# Patient Record
Sex: Female | Born: 1980 | Race: White | Hispanic: No | Smoking: Never smoker
Health system: Southern US, Community
[De-identification: ages and names within clinical notes are randomized; demographics above are authoritative.]

## PROBLEM LIST (undated history)

## (undated) ENCOUNTER — Ambulatory Visit: Source: Home / Self Care

## (undated) DIAGNOSIS — R109 Unspecified abdominal pain: Secondary | ICD-10-CM

## (undated) DIAGNOSIS — R531 Weakness: Secondary | ICD-10-CM

## (undated) DIAGNOSIS — N6310 Unspecified lump in the right breast, unspecified quadrant: Secondary | ICD-10-CM

## (undated) DIAGNOSIS — N390 Urinary tract infection, site not specified: Secondary | ICD-10-CM

## (undated) DIAGNOSIS — E876 Hypokalemia: Secondary | ICD-10-CM

## (undated) DIAGNOSIS — M545 Low back pain, unspecified: Secondary | ICD-10-CM

## (undated) DIAGNOSIS — H905 Unspecified sensorineural hearing loss: Secondary | ICD-10-CM

## (undated) DIAGNOSIS — R079 Chest pain, unspecified: Secondary | ICD-10-CM

## (undated) DIAGNOSIS — R10A0 Flank pain, unspecified side: Secondary | ICD-10-CM

## (undated) DIAGNOSIS — H9190 Unspecified hearing loss, unspecified ear: Secondary | ICD-10-CM

## (undated) DIAGNOSIS — R350 Frequency of micturition: Secondary | ICD-10-CM

## (undated) DIAGNOSIS — F419 Anxiety disorder, unspecified: Secondary | ICD-10-CM

## (undated) DIAGNOSIS — R55 Syncope and collapse: Secondary | ICD-10-CM

## (undated) DIAGNOSIS — R519 Headache, unspecified: Secondary | ICD-10-CM

## (undated) DIAGNOSIS — J029 Acute pharyngitis, unspecified: Secondary | ICD-10-CM

## (undated) DIAGNOSIS — H93A1 Pulsatile tinnitus, right ear: Secondary | ICD-10-CM

## (undated) DIAGNOSIS — T148XXA Other injury of unspecified body region, initial encounter: Secondary | ICD-10-CM

## (undated) DIAGNOSIS — Z789 Other specified health status: Secondary | ICD-10-CM

## (undated) DIAGNOSIS — K37 Unspecified appendicitis: Secondary | ICD-10-CM

## (undated) DIAGNOSIS — K047 Periapical abscess without sinus: Secondary | ICD-10-CM

## (undated) HISTORY — DX: Unspecified sensorineural hearing loss: H90.5

## (undated) HISTORY — DX: Syncope and collapse: R55

## (undated) HISTORY — DX: Unspecified appendicitis: K37

## (undated) HISTORY — DX: Other injury of unspecified body region, initial encounter: T14.8XXA

## (undated) HISTORY — DX: Flank pain, unspecified side: R10.A0

## (undated) HISTORY — DX: Urinary tract infection, site not specified: N39.0

## (undated) HISTORY — DX: Pulsatile tinnitus, right ear: H93.A1

## (undated) HISTORY — PX: GALLBLADDER SURGERY: SHX652

## (undated) HISTORY — DX: Frequency of micturition: R35.0

## (undated) HISTORY — DX: Chest pain, unspecified: R07.9

## (undated) HISTORY — DX: Acute pharyngitis, unspecified: J02.9

## (undated) HISTORY — DX: Unspecified lump in the right breast, unspecified quadrant: N63.10

## (undated) HISTORY — DX: Headache, unspecified: R51.9

## (undated) HISTORY — PX: TUBAL LIGATION: SHX77

## (undated) HISTORY — DX: Weakness: R53.1

## (undated) HISTORY — DX: Unspecified abdominal pain: R10.9

## (undated) HISTORY — DX: Hypokalemia: E87.6

## (undated) HISTORY — DX: Periapical abscess without sinus: K04.7

## (undated) HISTORY — DX: Unspecified hearing loss, unspecified ear: H91.90

## (undated) HISTORY — PX: CARPAL TUNNEL RELEASE: SHX101

## (undated) HISTORY — DX: Low back pain, unspecified: M54.50

---
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
## 1998-01-29 ENCOUNTER — Inpatient Hospital Stay (HOSPITAL_COMMUNITY): Admission: AD | Admit: 1998-01-29 | Discharge: 1998-01-29 | Payer: Self-pay | Admitting: Obstetrics

## 1998-03-03 ENCOUNTER — Inpatient Hospital Stay (HOSPITAL_COMMUNITY): Admission: AD | Admit: 1998-03-03 | Discharge: 1998-03-03 | Payer: Self-pay | Admitting: Obstetrics & Gynecology

## 1998-03-03 ENCOUNTER — Inpatient Hospital Stay (HOSPITAL_COMMUNITY): Admission: AD | Admit: 1998-03-03 | Discharge: 1998-03-03 | Payer: Self-pay | Admitting: *Deleted

## 1998-03-04 ENCOUNTER — Inpatient Hospital Stay (HOSPITAL_COMMUNITY): Admission: AD | Admit: 1998-03-04 | Discharge: 1998-03-06 | Payer: Self-pay | Admitting: *Deleted

## 1998-04-09 ENCOUNTER — Emergency Department (HOSPITAL_COMMUNITY): Admission: EM | Admit: 1998-04-09 | Discharge: 1998-04-09 | Payer: Self-pay | Admitting: Emergency Medicine

## 1998-04-09 ENCOUNTER — Encounter: Payer: Self-pay | Admitting: Emergency Medicine

## 1998-10-04 ENCOUNTER — Emergency Department (HOSPITAL_COMMUNITY): Admission: EM | Admit: 1998-10-04 | Discharge: 1998-10-04 | Payer: Self-pay | Admitting: Emergency Medicine

## 1999-12-10 ENCOUNTER — Emergency Department (HOSPITAL_COMMUNITY): Admission: EM | Admit: 1999-12-10 | Discharge: 1999-12-10 | Payer: Self-pay | Admitting: Emergency Medicine

## 1999-12-14 ENCOUNTER — Encounter: Admission: RE | Admit: 1999-12-14 | Discharge: 1999-12-14 | Payer: Self-pay | Admitting: Hematology and Oncology

## 2000-08-25 ENCOUNTER — Emergency Department (HOSPITAL_COMMUNITY): Admission: EM | Admit: 2000-08-25 | Discharge: 2000-08-26 | Payer: Self-pay | Admitting: Emergency Medicine

## 2000-12-23 ENCOUNTER — Emergency Department (HOSPITAL_COMMUNITY): Admission: EM | Admit: 2000-12-23 | Discharge: 2000-12-23 | Payer: Self-pay | Admitting: Emergency Medicine

## 2001-03-10 ENCOUNTER — Inpatient Hospital Stay (HOSPITAL_COMMUNITY): Admission: AD | Admit: 2001-03-10 | Discharge: 2001-03-10 | Payer: Self-pay | Admitting: Obstetrics & Gynecology

## 2001-05-16 ENCOUNTER — Inpatient Hospital Stay (HOSPITAL_COMMUNITY): Admission: AD | Admit: 2001-05-16 | Discharge: 2001-05-16 | Payer: Self-pay | Admitting: *Deleted

## 2003-12-04 ENCOUNTER — Emergency Department (HOSPITAL_COMMUNITY): Admission: EM | Admit: 2003-12-04 | Discharge: 2003-12-04 | Payer: Self-pay | Admitting: Emergency Medicine

## 2004-09-02 ENCOUNTER — Emergency Department: Payer: Self-pay | Admitting: Emergency Medicine

## 2004-09-10 ENCOUNTER — Emergency Department (HOSPITAL_COMMUNITY): Admission: EM | Admit: 2004-09-10 | Discharge: 2004-09-10 | Payer: Self-pay | Admitting: Family Medicine

## 2005-03-17 ENCOUNTER — Emergency Department: Payer: Self-pay | Admitting: Emergency Medicine

## 2006-04-07 ENCOUNTER — Emergency Department (HOSPITAL_COMMUNITY): Admission: EM | Admit: 2006-04-07 | Discharge: 2006-04-08 | Payer: Self-pay | Admitting: Emergency Medicine

## 2006-09-05 ENCOUNTER — Emergency Department (HOSPITAL_COMMUNITY): Admission: EM | Admit: 2006-09-05 | Discharge: 2006-09-05 | Payer: Self-pay | Admitting: Family Medicine

## 2007-09-01 ENCOUNTER — Emergency Department (HOSPITAL_COMMUNITY): Admission: EM | Admit: 2007-09-01 | Discharge: 2007-09-01 | Payer: Self-pay | Admitting: Family Medicine

## 2009-10-08 ENCOUNTER — Emergency Department (HOSPITAL_COMMUNITY): Admission: EM | Admit: 2009-10-08 | Discharge: 2009-10-08 | Payer: Self-pay | Admitting: Family Medicine

## 2010-08-25 ENCOUNTER — Emergency Department (HOSPITAL_COMMUNITY)
Admission: EM | Admit: 2010-08-25 | Discharge: 2010-08-26 | Disposition: A | Discharge status: Home / Self Care | Payer: Self-pay | Attending: Emergency Medicine | Admitting: Emergency Medicine

## 2010-08-25 DIAGNOSIS — E669 Obesity, unspecified: Secondary | ICD-10-CM | POA: Insufficient documentation

## 2010-08-25 DIAGNOSIS — IMO0002 Reserved for concepts with insufficient information to code with codable children: Secondary | ICD-10-CM | POA: Insufficient documentation

## 2010-08-25 DIAGNOSIS — L539 Erythematous condition, unspecified: Secondary | ICD-10-CM | POA: Insufficient documentation

## 2010-08-27 ENCOUNTER — Observation Stay (HOSPITAL_COMMUNITY)
Admission: EM | Admit: 2010-08-27 | Discharge: 2010-08-27 | Disposition: A | Discharge status: Home / Self Care | Payer: Self-pay | Attending: Emergency Medicine | Admitting: Emergency Medicine

## 2010-08-27 DIAGNOSIS — R110 Nausea: Secondary | ICD-10-CM | POA: Insufficient documentation

## 2010-08-27 DIAGNOSIS — L988 Other specified disorders of the skin and subcutaneous tissue: Secondary | ICD-10-CM | POA: Insufficient documentation

## 2010-08-27 DIAGNOSIS — IMO0002 Reserved for concepts with insufficient information to code with codable children: Principal | ICD-10-CM | POA: Insufficient documentation

## 2010-08-27 DIAGNOSIS — E669 Obesity, unspecified: Secondary | ICD-10-CM | POA: Insufficient documentation

## 2010-08-27 LAB — CBC
HCT: 42 % (ref 36.0–46.0)
Hemoglobin: 13.9 g/dL (ref 12.0–15.0)
MCH: 28.5 pg (ref 26.0–34.0)
MCHC: 33.1 g/dL (ref 30.0–36.0)
MCV: 86.2 fL (ref 78.0–100.0)
Platelets: 306 10*3/uL (ref 150–400)
RBC: 4.87 MIL/uL (ref 3.87–5.11)
RDW: 12.9 % (ref 11.5–15.5)
WBC: 8.8 10*3/uL (ref 4.0–10.5)

## 2010-08-27 LAB — BASIC METABOLIC PANEL
BUN: 13 mg/dL (ref 6–23)
CO2: 24 mEq/L (ref 19–32)
Calcium: 9.4 mg/dL (ref 8.4–10.5)
Chloride: 109 mEq/L (ref 96–112)
Creatinine, Ser: 0.75 mg/dL (ref 0.4–1.2)
GFR calc Af Amer: >60 mL/min (ref >60)
GFR calc non Af Amer: >60 mL/min (ref >60)
Glucose, Bld: 101 mg/dL — ABNORMAL HIGH (ref 70–99)
Potassium: 3.5 mEq/L (ref 3.5–5.1)
Sodium: 139 mEq/L (ref 135–145)

## 2010-08-27 LAB — URINALYSIS, ROUTINE W REFLEX MICROSCOPIC
Bilirubin Urine: NEGATIVE
Ketones, ur: NEGATIVE mg/dL
Nitrite: NEGATIVE
Protein, ur: NEGATIVE mg/dL
Specific Gravity, Urine: 1.027 (ref 1.005–1.030)
Urine Glucose, Fasting: NEGATIVE mg/dL
Urobilinogen, UA: 1 mg/dL (ref 0.0–1.0)
pH: 5.5 (ref 5.0–8.0)

## 2010-08-27 LAB — DIFFERENTIAL
Basophils Absolute: 0 10*3/uL (ref 0.0–0.1)
Basophils Relative: 0 % (ref 0–1)
Eosinophils Absolute: 0.2 10*3/uL (ref 0.0–0.7)
Eosinophils Relative: 2 % (ref 0–5)
Lymphocytes Relative: 31 % (ref 12–46)
Lymphs Abs: 2.8 10*3/uL (ref 0.7–4.0)
Monocytes Absolute: 0.6 10*3/uL (ref 0.1–1.0)
Monocytes Relative: 7 % (ref 3–12)
Neutro Abs: 5.3 10*3/uL (ref 1.7–7.7)
Neutrophils Relative %: 60 % (ref 43–77)

## 2010-08-27 LAB — POCT PREGNANCY, URINE: Preg Test, Ur: NEGATIVE

## 2010-08-27 LAB — URINE MICROSCOPIC-ADD ON

## 2011-03-21 ENCOUNTER — Emergency Department: Payer: Self-pay | Admitting: *Deleted

## 2011-11-18 ENCOUNTER — Emergency Department: Payer: Self-pay | Admitting: Emergency Medicine

## 2011-12-27 ENCOUNTER — Encounter (HOSPITAL_COMMUNITY): Payer: Self-pay | Admitting: *Deleted

## 2011-12-27 ENCOUNTER — Emergency Department (HOSPITAL_COMMUNITY)
Admission: EM | Admit: 2011-12-27 | Discharge: 2011-12-27 | Disposition: A | Discharge status: Home / Self Care | Payer: Self-pay | Attending: Emergency Medicine | Admitting: Emergency Medicine

## 2011-12-27 DIAGNOSIS — J020 Streptococcal pharyngitis: Secondary | ICD-10-CM | POA: Insufficient documentation

## 2011-12-27 MED ORDER — CLINDAMYCIN HCL 150 MG PO CAPS: 450.0000 mg | ORAL_CAPSULE | Freq: Three times a day (TID) | ORAL | Status: AC

## 2011-12-27 MED ORDER — DEXAMETHASONE SODIUM PHOSPHATE 10 MG/ML IJ SOLN
10.0000 mg | Freq: Once | INTRAMUSCULAR | Status: AC
  Administered 2011-12-27: 10 mg via INTRAMUSCULAR
  Filled 2011-12-27: qty 1

## 2011-12-27 MED ORDER — CLINDAMYCIN HCL 300 MG PO CAPS
450.0000 mg | ORAL_CAPSULE | Freq: Once | ORAL | Status: AC
  Administered 2011-12-27: 450 mg via ORAL
  Filled 2011-12-27: qty 1

## 2011-12-27 NOTE — Discharge Instructions (Signed)
1. [facility] - [address] in [place] located on the [place] side of the long-term care facility. Pharmacy hours are 7:30 a.m. - 5:30 p.m. Monday through Friday. You can reach the [facility] location at [phone]. 2. [facility] - [address] in [place]. Pharmacy hours are 7:30 a.m. - 7:30 p.m. Monday through Friday. You can reach the [facility] location at [phone]. 3. [facility] - [address] [employer] in [place]. Pharmacy hours are 7:30 a.m. until 7:30 p.m. Monday through Friday. You can reach the [facility] location at [phone].   Use ibuprofen every 6-8 hours as needed for pain/fever. Take the antibiotic as directed. You should start to feel better in 24-48 hours.  Strep Throat Strep throat is an infection of the throat caused by a bacteria named Streptococcus pyogenes. Your caregiver may call the infection streptococcal "tonsillitis" or "pharyngitis" depending on whether there are signs of inflammation in the tonsils or back of the throat. Strep throat is most common in children from 57 to 13 years old during the cold months of the year, but it can occur in people of any age during any season. This infection is spread from person to person (contagious) through coughing, sneezing, or other close contact. SYMPTOMS   Fever or chills.   Painful, swollen, red tonsils or throat.   Pain or difficulty when swallowing.   White or yellow spots on the tonsils or throat.   Swollen, tender lymph nodes or "glands" of the neck or under the jaw.   Red rash all over the body (rare).  DIAGNOSIS  Many different infections can cause the same symptoms. A test must be done to confirm the diagnosis so the right treatment can be given. A "rapid strep test" can help your caregiver make the diagnosis in a few minutes. If this test is not available, a light swab of the infected area can be used for a throat culture test. If a  throat culture test is done, results are usually available in a day or two. TREATMENT  Strep throat is treated with antibiotic medicine. HOME CARE INSTRUCTIONS   Gargle with 1 tsp of salt in 1 cup of warm water, 3 to 4 times per day or as needed for comfort.   Family members who also have a sore throat or fever should be tested for strep throat and treated with antibiotics if they have the strep infection.   Make sure everyone in your household washes their hands well.   Do not share food, drinking cups, or personal items that could cause the infection to spread to others.   You may need to eat a soft food diet until your sore throat gets better.   Drink enough water and fluids to keep your urine clear or pale yellow. This will help prevent dehydration.   Get plenty of rest.   Stay home from school, daycare, or work until you have been on antibiotics for 24 hours.   Only take over-the-counter or prescription medicines for pain, discomfort, or fever as directed by your caregiver.   If antibiotics are prescribed, take them as directed. Finish them even if you start to feel better.  SEEK MEDICAL CARE IF:   The glands in your neck continue to enlarge.   You develop a rash, cough, or earache.   You cough up green, yellow-brown, or bloody sputum.   You have pain or discomfort not controlled by  medicines.   Your problems seem to be getting worse rather than better.  SEEK IMMEDIATE MEDICAL CARE IF:   You develop any new symptoms such as vomiting, severe headache, stiff or painful neck, chest pain, shortness of breath, or trouble swallowing.   You develop severe throat pain, drooling, or changes in your voice.   You develop swelling of the neck, or the skin on the neck becomes red and tender.   You have a fever.   You develop signs of dehydration, such as fatigue, dry mouth, and decreased urination.   You become increasingly sleepy, or you cannot wake up completely.  Document  Released: 07/02/2000 Document Revised: 06/24/2011 Document Reviewed: 09/03/2010 [facility] Patient Information 2012 [place], [place]

## 2011-12-27 NOTE — ED Notes (Signed)
The pp has had a sorethroat since yesterday no temp

## 2011-12-27 NOTE — ED Provider Notes (Signed)
History     CSN: [id number]  Arrival date & time 12/27/11  1953   First MD Initiated Contact with Patient 12/27/11 2042      Chief Complaint  Patient presents with  . Sore Throat    (Consider location/radiation/quality/duration/timing/severity/associated sxs/prior treatment) The history is provided by the patient.   there is female with no past medical history presents to the emergency department with chief complaint of 1 and a sore throat associated with chills, difficulty swallowing, swollen glands in the neck. Denies fever, ear pain, rhinorrhea, cough, chest pain, shortness of breath. Swallowing makes her symptoms worse. Nothing makes it better. Has taken tylenol without much relief.  History reviewed. No pertinent past medical history.  History reviewed. No pertinent past surgical history.  No family history on file.  History  Substance Use Topics  . Smoking status: Never Smoker   . Smokeless tobacco: Not on file  . Alcohol Use: No     Review of Systems  Constitutional: Positive for chills. Negative for fever.  HENT: Positive for sore throat and trouble swallowing. Negative for ear pain, congestion, rhinorrhea, neck pain, neck stiffness and voice change.   Eyes: Negative for pain.  Respiratory: Negative for shortness of breath.   Cardiovascular: Negative for chest pain.  Hematological: Positive for adenopathy.    Allergies  Penicillins  Home Medications   Current Outpatient Rx  Name Route Sig Dispense Refill  . ACETAMINOPHEN 500 MG PO TABS Oral Take 1,000 mg by mouth every 6 (six) hours as needed. For pain      BP 114/74  Pulse 107  Temp(Src) 98.5 F (36.9 C) (Oral)  Resp 18  SpO2 99%  LMP 12/06/2011  Physical Exam  Constitutional: She appears well-developed and well-nourished.       VS reviewed, sig for slight tachycardia. Non-toxic appearing  HENT:  Head: Normocephalic and atraumatic. No trismus in the jaw.  Right Ear: External ear normal.  Left  Ear: External ear normal.  Nose: Nose normal.  Mouth/Throat: Uvula is midline and mucous membranes are normal. No uvula swelling. Oropharyngeal exudate, posterior oropharyngeal edema and posterior oropharyngeal erythema present. No tonsillar abscesses.  Neck: Normal range of motion. Neck supple.  Cardiovascular: Normal rate, regular rhythm and normal heart sounds.   Pulmonary/Chest: Effort normal and breath sounds normal. No respiratory distress. She has no wheezes.  Lymphadenopathy:    She has cervical adenopathy.  Neurological: She is alert.  Skin: Skin is warm.    ED Course  Procedures (including critical care time)  Labs Reviewed  RAPID STREP SCREEN - Abnormal; Notable for the following:    Streptococcus, Group A Screen (Direct) POSITIVE (*)    All other components within normal limits   No results found.   Dx 1: Strep pharyngitis   MDM  Strep pharyngitis. Allergic to PCN, will tx with clinda. Airway patent, able to swallow with pain. No evidence of ludwig angina, peritonsillar abscess.        Shaaron Adler, [place] 12/27/11 2051

## 2011-12-31 NOTE — ED Provider Notes (Signed)
Medical screening examination/treatment/procedure(s) were performed by non-physician practitioner and as supervising physician I was immediately available for consultation/collaboration.    Nelia Shi, MD 12/31/11 1110

## 2012-01-18 ENCOUNTER — Emergency Department (HOSPITAL_COMMUNITY)
Admission: EM | Admit: 2012-01-18 | Discharge: 2012-01-19 | Disposition: A | Discharge status: Home / Self Care | Payer: Self-pay | Attending: Emergency Medicine | Admitting: Emergency Medicine

## 2012-01-18 ENCOUNTER — Encounter (HOSPITAL_COMMUNITY): Payer: Self-pay | Admitting: *Deleted

## 2012-01-18 DIAGNOSIS — T310 Burns involving less than 10% of body surface: Secondary | ICD-10-CM | POA: Insufficient documentation

## 2012-01-18 DIAGNOSIS — T24202A Burn of second degree of unspecified site of left lower limb, except ankle and foot, initial encounter: Secondary | ICD-10-CM

## 2012-01-18 DIAGNOSIS — T24239A Burn of second degree of unspecified lower leg, initial encounter: Secondary | ICD-10-CM | POA: Insufficient documentation
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
## 2012-01-19 MED ORDER — HYDROCODONE-ACETAMINOPHEN 5-325 MG PO TABS: 1.0000 | ORAL_TABLET | Freq: Four times a day (QID) | ORAL | Status: AC | PRN

## 2012-01-19 MED ORDER — CLINDAMYCIN HCL 150 MG PO CAPS: 150.0000 mg | ORAL_CAPSULE | Freq: Four times a day (QID) | ORAL | Status: AC

## 2012-01-19 MED ORDER — SILVER SULFADIAZINE 1 % EX CREA
TOPICAL_CREAM | Freq: Two times a day (BID) | CUTANEOUS | Status: DC
  Administered 2012-01-19: 01:00:00 via TOPICAL
  Filled 2012-01-19: qty 85

## 2012-01-19 MED ORDER — SILVER SULFADIAZINE 1 % EX CREA: TOPICAL_CREAM | Freq: Two times a day (BID) | CUTANEOUS | Status: DC

## 2012-01-19 NOTE — ED Provider Notes (Signed)
Medical screening examination/treatment/procedure(s) were performed by non-physician practitioner and as supervising physician I was immediately available for consultation/collaboration.  Doug Sou, MD 01/19/12 [phone]
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
## 2012-03-10 ENCOUNTER — Emergency Department: Payer: Self-pay | Admitting: Emergency Medicine

## 2012-03-10 LAB — PREGNANCY, URINE: Pregnancy Test, Urine: NEGATIVE m[IU]/mL

## 2012-03-10 LAB — URINALYSIS, COMPLETE
Bilirubin,UR: NEGATIVE
Blood: NEGATIVE
Ph: 5 (ref 4.5–8.0)
Protein: NEGATIVE
RBC,UR: 2 /HPF (ref 0–5)
Squamous Epithelial: 5

## 2012-04-19 ENCOUNTER — Emergency Department (HOSPITAL_COMMUNITY)
Admission: EM | Admit: 2012-04-19 | Discharge: 2012-04-20 | Disposition: A | Discharge status: Home / Self Care | Payer: Self-pay | Attending: Emergency Medicine | Admitting: Emergency Medicine

## 2012-04-19 ENCOUNTER — Encounter (HOSPITAL_COMMUNITY): Payer: Self-pay | Admitting: Adult Health

## 2012-04-19 DIAGNOSIS — R110 Nausea: Secondary | ICD-10-CM | POA: Insufficient documentation

## 2012-04-19 DIAGNOSIS — R51 Headache: Secondary | ICD-10-CM | POA: Insufficient documentation

## 2012-04-19 DIAGNOSIS — M542 Cervicalgia: Secondary | ICD-10-CM | POA: Insufficient documentation

## 2012-04-19 MED ORDER — DEXAMETHASONE SODIUM PHOSPHATE 10 MG/ML IJ SOLN
10.0000 mg | Freq: Once | INTRAMUSCULAR | Status: AC
  Administered 2012-04-19: 10 mg via INTRAVENOUS
  Filled 2012-04-19: qty 1

## 2012-04-19 MED ORDER — METOCLOPRAMIDE HCL 5 MG/ML IJ SOLN
10.0000 mg | Freq: Once | INTRAMUSCULAR | Status: AC
  Administered 2012-04-19: 10 mg via INTRAVENOUS
  Filled 2012-04-19: qty 2

## 2012-04-19 MED ORDER — SODIUM CHLORIDE 0.9 % IV SOLN
INTRAVENOUS | Status: DC
  Administered 2012-04-19: via INTRAVENOUS

## 2012-04-19 MED ORDER — DIPHENHYDRAMINE HCL 50 MG/ML IJ SOLN
12.5000 mg | Freq: Once | INTRAMUSCULAR | Status: AC
  Administered 2012-04-19: 12.5 mg via INTRAVENOUS
  Filled 2012-04-19: qty 1
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
## 2012-04-20 ENCOUNTER — Emergency Department (HOSPITAL_COMMUNITY): Payer: Self-pay

## 2012-04-20 ENCOUNTER — Encounter (HOSPITAL_COMMUNITY): Payer: Self-pay | Admitting: Radiology

## 2012-04-20 MED ORDER — KETOROLAC TROMETHAMINE 30 MG/ML IJ SOLN: 30.0000 mg | Freq: Once | INTRAMUSCULAR | Status: DC

## 2012-04-20 MED ORDER — HYDROCODONE-ACETAMINOPHEN 5-325 MG PO TABS: 1.0000 | ORAL_TABLET | ORAL | Status: DC | PRN

## 2012-04-20 MED ORDER — BUTALBITAL-APAP-CAFFEINE 50-325-40 MG PO TABS: 1.0000 | ORAL_TABLET | Freq: Four times a day (QID) | ORAL | Status: DC | PRN

## 2012-04-20 NOTE — ED Notes (Signed)
Pt denies any questions, verbalize understanding of f/u instructions and no driving to prescription medications upon discharge.

## 2012-04-20 NOTE — ED Notes (Signed)
Pt resting with eyes closed, resp e/u and no s/s of any pain or distress observed at this time and will continue to monitor pt.
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
## 2012-04-20 NOTE — ED Provider Notes (Signed)
Medical screening examination/treatment/procedure(s) were performed by non-physician practitioner and as supervising physician I was immediately available for consultation/collaboration.    Vida Roller, MD 04/20/12 [phone]

## 2012-05-17 ENCOUNTER — Emergency Department (HOSPITAL_COMMUNITY): Payer: Self-pay

## 2012-05-17 ENCOUNTER — Emergency Department (HOSPITAL_COMMUNITY)
Admission: EM | Admit: 2012-05-17 | Discharge: 2012-05-17 | Disposition: A | Discharge status: Home / Self Care | Payer: Self-pay | Attending: Emergency Medicine | Admitting: Emergency Medicine

## 2012-05-17 DIAGNOSIS — R5381 Other malaise: Secondary | ICD-10-CM | POA: Insufficient documentation

## 2012-05-17 DIAGNOSIS — R0602 Shortness of breath: Secondary | ICD-10-CM | POA: Insufficient documentation

## 2012-05-17 DIAGNOSIS — R079 Chest pain, unspecified: Secondary | ICD-10-CM

## 2012-05-17 DIAGNOSIS — R0789 Other chest pain: Secondary | ICD-10-CM | POA: Insufficient documentation

## 2012-05-17 DIAGNOSIS — R42 Dizziness and giddiness: Secondary | ICD-10-CM | POA: Insufficient documentation

## 2012-05-17 LAB — CBC WITH DIFFERENTIAL/PLATELET
Basophils Absolute: 0 10*3/uL (ref 0.0–0.1)
Eosinophils Absolute: 0.1 10*3/uL (ref 0.0–0.7)
Lymphocytes Relative: 25 % (ref 12–46)
Lymphs Abs: 2.4 10*3/uL (ref 0.7–4.0)
Neutrophils Relative %: 67 % (ref 43–77)
Platelets: 311 10*3/uL (ref 150–400)
RBC: 4.84 MIL/uL (ref 3.87–5.11)
RDW: 12.7 % (ref 11.5–15.5)
WBC: 9.6 10*3/uL (ref 4.0–10.5)

## 2012-05-17 LAB — URINALYSIS, ROUTINE W REFLEX MICROSCOPIC
Glucose, UA: NEGATIVE mg/dL
Ketones, ur: 15 mg/dL — AB
Leukocytes, UA: NEGATIVE
Nitrite: NEGATIVE
Protein, ur: NEGATIVE mg/dL

## 2012-05-17 LAB — POCT I-STAT TROPONIN I: Troponin i, poc: 0 ng/mL (ref 0.00–0.08)

## 2012-05-17 LAB — D-DIMER, QUANTITATIVE: D-Dimer, Quant: <0.27 ug/mL-FEU (ref 0.00–0.48)

## 2012-05-17 LAB — URINE MICROSCOPIC-ADD ON

## 2012-05-17 LAB — BASIC METABOLIC PANEL
CO2: 25 mEq/L (ref 19–32)
Calcium: 9.6 mg/dL (ref 8.4–10.5)
GFR calc non Af Amer: >90 mL/min (ref >90)
Glucose, Bld: 98 mg/dL (ref 70–99)
Potassium: 3 mEq/L — ABNORMAL LOW (ref 3.5–5.1)
Sodium: 135 mEq/L (ref 135–145)

## 2012-05-17 LAB — PREGNANCY, URINE: Preg Test, Ur: NEGATIVE

## 2012-05-17 MED ORDER — POTASSIUM CHLORIDE CRYS ER 20 MEQ PO TBCR
40.0000 meq | EXTENDED_RELEASE_TABLET | Freq: Once | ORAL | Status: AC
  Administered 2012-05-17: 40 meq via ORAL
  Filled 2012-05-17: qty 2

## 2012-05-17 MED ORDER — SODIUM CHLORIDE 0.9 % IV BOLUS (SEPSIS)
1000.0000 mL | Freq: Once | INTRAVENOUS | Status: AC
  Administered 2012-05-17: 1000 mL via INTRAVENOUS

## 2012-05-17 MED ORDER — ONDANSETRON HCL 4 MG/2ML IJ SOLN
4.0000 mg | Freq: Once | INTRAMUSCULAR | Status: AC
  Administered 2012-05-17: 4 mg via INTRAVENOUS
  Filled 2012-05-17: qty 2

## 2012-05-17 MED ORDER — OMEPRAZOLE 20 MG PO CPDR: 20.0000 mg | DELAYED_RELEASE_CAPSULE | Freq: Every day | ORAL | Status: DC

## 2012-05-17 MED ORDER — ALBUTEROL SULFATE (5 MG/ML) 0.5% IN NEBU
5.0000 mg | INHALATION_SOLUTION | Freq: Once | RESPIRATORY_TRACT | Status: AC
  Administered 2012-05-17: 5 mg via RESPIRATORY_TRACT
  Filled 2012-05-17: qty 1

## 2012-05-17 MED ORDER — LORAZEPAM 1 MG PO TABS
1.0000 mg | ORAL_TABLET | Freq: Once | ORAL | Status: AC
  Administered 2012-05-17: 1 mg via ORAL
  Filled 2012-05-17: qty 1

## 2012-05-17 NOTE — ED Provider Notes (Signed)
Medical screening examination/treatment/procedure(s) were performed by non-physician practitioner and as supervising physician I was immediately available for consultation/collaboration.  Gerhard Munch, MD 05/17/12 [phone]

## 2012-05-17 NOTE — ED Provider Notes (Signed)
Patient moved to the CDU for a delta troponin result. Patient with sudden onset of substernal chest pressure last night which persisted throughout the evening. This morning she episode of dizziness and near syncope.  Lab results largely unremarkable. He d-dimer negative.  Initial troponin negative.  Patient is resting comfortably this time and tolerating PO without difficulty.  Pt states the pressures in her chest remains, but she has no other complaints. On exam: hemodynamically stable, NAD, heart w/ RRR, lungs CTAB, Chest & abd non-tender, no peripheral edema or calf tenderness.   12:58 PM   Repeat troponin negative, IV fluids given. Pt states she feels moderately better after fluids.  Patient is to be discharged with recommendation to follow up with PCP in regards to today's hospital visit. Chest pain is not likely of cardiac or pulmonary etiology d/t presentation, perc negative, VSS, no tracheal deviation, no JVD or new murmur, RRR, breath sounds equal bilaterally, EKG without acute abnormalities, negative troponin, and negative CXR. Pt has been advised start a PPI and return to the ED if CP becomes exertional, associated with diaphoresis or nausea, radiates to left jaw/arm, worsens or becomes concerning in any way. Pt is agreeable to discharge.    1. Medications: prilosec  2. Treatment: rest, drink plenty of fluids  3. Follow Up: with primary care physician, if you do not have a PCP use the resources provided to find one   Dierdre Forth, PA-C 05/17/12 1346

## 2012-05-17 NOTE — ED Provider Notes (Signed)
History     CSN: [id number]  Arrival date & time 05/17/12  4098   First MD Initiated Contact with Patient 05/17/12 [phone]      Chief Complaint  Patient presents with  . Chest Pain    (Consider location/radiation/quality/duration/timing/severity/associated sxs/prior treatment) Patient is a 31 y.o. female presenting with chest pain. The history is provided by the patient.  Chest Pain The chest pain began yesterday. Chest pain occurs constantly. The chest pain is unchanged. The quality of the pain is described as pressure-like. The pain does not radiate. Primary symptoms include fatigue, shortness of breath and dizziness. Pertinent negatives for primary symptoms include no fever, no cough, no wheezing, no palpitations, no abdominal pain, no nausea and no vomiting.  Dizziness does not occur with nausea, vomiting or weakness.   Pertinent negatives for associated symptoms include no weakness.   Pt states was not feeling well yesterday. Developed substernal pressure last night, lasted all through the night. States this morning, symptoms became worse. States felt like was going to faint when got up to get ready. States chest pain feels like pressure, associated with SOB. No pleuritic component. NO URI symptoms, no abdominal pain, no N/V. No recent travel or surgeries. Not a smoker.   No past medical history on file.  Past Surgical History  Procedure Date  . Tubal ligation     No family history on file.  History  Substance Use Topics  . Smoking status: Never Smoker   . Smokeless tobacco: Not on file  . Alcohol Use: No    OB History    Grav Para Term Preterm Abortions TAB SAB Ect Mult Living                  Review of Systems  Constitutional: Positive for fatigue. Negative for fever and chills.  Respiratory: Positive for chest tightness and shortness of breath. Negative for cough and wheezing.   Cardiovascular: Positive for chest pain. Negative for palpitations and leg swelling.   Gastrointestinal: Negative for nausea, vomiting and abdominal pain.  Neurological: Positive for dizziness and light-headedness. Negative for weakness and headaches.  All other systems reviewed and are negative.    Allergies  Penicillins  Home Medications   Current Outpatient Rx  Name Route Sig Dispense Refill  . ACETAMINOPHEN 500 MG PO TABS Oral Take 1,000 mg by mouth every 6 (six) hours as needed. For pain    . BUTALBITAL-APAP-CAFFEINE 50-325-40 MG PO TABS Oral Take 1-2 tablets by mouth every 6 (six) hours as needed for headache. Do not exceed 6 tablets in 24 hours 20 tablet 0  . HYDROCODONE-ACETAMINOPHEN 5-325 MG PO TABS Oral Take 1 tablet by mouth every 4 (four) hours as needed for pain. 10 tablet 0    LMP 04/13/2012  Physical Exam  Nursing note and vitals reviewed. Constitutional: She is oriented to person, place, and time. She appears well-developed and well-nourished. No distress.  Eyes: Conjunctivae normal are normal.  Neck: Neck supple.  Cardiovascular: Regular rhythm and normal heart sounds.        tachycardic  Pulmonary/Chest: Effort normal and breath sounds normal. She has no wheezes. She has no rales. She exhibits no tenderness.       tachypnea  Musculoskeletal: She exhibits no edema.       No calf tenderness  Neurological: She is alert and oriented to person, place, and time.  Skin: Skin is warm and dry.  Psychiatric: She has a normal mood and affect.    ED  Course  Procedures (including critical care time)  Pt appears anxious, she is tachypnec, tachycardic. Will get labs CXR. Will monitor.    Date: 05/17/2012  Rate: 102  Rhythm: sinus tachycardia  QRS Axis: normal  Intervals: normal  ST/T Wave abnormalities: nonspecific T wave changes  Conduction Disutrbances:none  Narrative Interpretation:   Old EKG Reviewed: none available  Results for orders placed during the hospital encounter of 05/17/12  CBC WITH DIFFERENTIAL      Component Value Range   WBC  9.6  4.0 - 10.5 K/uL   RBC 4.84  3.87 - 5.11 MIL/uL   Hemoglobin 14.2  12.0 - 15.0 g/dL   HCT 96.0  45.4 - 09.8 %   MCV 84.5  78.0 - 100.0 fL   MCH 29.3  26.0 - 34.0 pg   MCHC 34.7  30.0 - 36.0 g/dL   RDW 11.9  14.7 - 82.9 %   Platelets 311  150 - 400 K/uL   Neutrophils Relative 67  43 - 77 %   Neutro Abs 6.5  1.7 - 7.7 K/uL   Lymphocytes Relative 25  12 - 46 %   Lymphs Abs 2.4  0.7 - 4.0 K/uL   Monocytes Relative 7  3 - 12 %   Monocytes Absolute 0.6  0.1 - 1.0 K/uL   Eosinophils Relative 1  0 - 5 %   Eosinophils Absolute 0.1  0.0 - 0.7 K/uL   Basophils Relative 0  0 - 1 %   Basophils Absolute 0.0  0.0 - 0.1 K/uL  BASIC METABOLIC PANEL      Component Value Range   Sodium 135  135 - 145 mEq/L   Potassium 3.0 (*) 3.5 - 5.1 mEq/L   Chloride 100  96 - 112 mEq/L   CO2 25  19 - 32 mEq/L   Glucose, Bld 98  70 - 99 mg/dL   BUN 13  6 - 23 mg/dL   Creatinine, Ser 5.62  0.50 - 1.10 mg/dL   Calcium 9.6  8.4 - 13.0 mg/dL   GFR calc non Af Amer >90  >90 mL/min   GFR calc Af Amer >90  >90 mL/min  D-DIMER, QUANTITATIVE      Component Value Range   D-Dimer, Quant <0.27  0.00 - 0.48 ug/mL-FEU  URINALYSIS, ROUTINE W REFLEX MICROSCOPIC      Component Value Range   Color, Urine AMBER (*) YELLOW   APPearance CLEAR  CLEAR   Specific Gravity, Urine 1.028  1.005 - 1.030   pH 6.5  5.0 - 8.0   Glucose, UA NEGATIVE  NEGATIVE mg/dL   Hgb urine dipstick SMALL (*) NEGATIVE   Bilirubin Urine SMALL (*) NEGATIVE   Ketones, ur 15 (*) NEGATIVE mg/dL   Protein, ur NEGATIVE  NEGATIVE mg/dL   Urobilinogen, UA 1.0  0.0 - 1.0 mg/dL   Nitrite NEGATIVE  NEGATIVE   Leukocytes, UA NEGATIVE  NEGATIVE  PREGNANCY, URINE      Component Value Range   Preg Test, Ur NEGATIVE  NEGATIVE  POCT I-STAT TROPONIN I      Component Value Range   Troponin i, poc 0.01  0.00 - 0.08 ng/mL   Comment 3           URINE MICROSCOPIC-ADD ON      Component Value Range   Squamous Epithelial / LPF RARE  RARE   WBC, UA 0-2  <3  WBC/hpf   RBC / HPF 0-2  <3 RBC/hpf   Bacteria, UA  RARE  RARE   Urine-Other MUCOUS PRESENT    POCT I-STAT TROPONIN I      Component Value Range   Troponin i, poc 0.00  0.00 - 0.08 ng/mL   Comment 3            Dg Chest 2 View  05/17/2012  *RADIOLOGY REPORT*  Clinical Data: Chest pressure and shortness of breath.  CHEST - 2 VIEW  Comparison: 05/17/2012.  Findings: Trachea is midline.  Heart size normal. There may be minimal atelectasis at the right lung base versus superimposition of shadows.  No pleural fluid.  IMPRESSION: Question minimal atelectasis at the right lung base.  Lungs are otherwise clear.   Original Report Authenticated By: Reyes Ivan, M.D.     Labs unremarkable. CDU for recheck of troponin at 11:45. If negative, ok to go home. Pt does appear very anxious, ativan given. Her ECG, labs, troponin x1, d dimer negative. She has no risk factors for CAD.  Disposition home if negative enzymes with pcp follow up    1. Chest pain       MDM          Lottie Mussel, PA 05/17/12 1525

## 2012-05-17 NOTE — ED Notes (Signed)
LUNCH ORDERED FOR PT 

## 2012-05-17 NOTE — ED Provider Notes (Signed)
Medical screening examination/treatment/procedure(s) were performed by non-physician practitioner and as supervising physician I was immediately available for consultation/collaboration.  Veretta Sabourin, MD 05/17/12 1557 

## 2012-05-17 NOTE — ED Notes (Signed)
Family at bedside. 

## 2012-05-17 NOTE — ED Notes (Signed)
PT reports sudden set of substernal CP. Denies any SHOP. Dizzyness. EMS gve 1 ngt SL and 324 ASA.

## 2012-05-17 NOTE — ED Notes (Signed)
Phlebotomy at the bedside  

## 2012-08-17 ENCOUNTER — Ambulatory Visit: Payer: Self-pay | Admitting: Orthopedic Surgery

## 2012-09-07 ENCOUNTER — Emergency Department (INDEPENDENT_AMBULATORY_CARE_PROVIDER_SITE_OTHER)
Admission: EM | Admit: 2012-09-07 | Discharge: 2012-09-07 | Disposition: A | Discharge status: Home / Self Care | Payer: Self-pay | Source: Home / Self Care

## 2012-09-07 ENCOUNTER — Encounter (HOSPITAL_COMMUNITY): Payer: Self-pay | Admitting: Emergency Medicine

## 2012-09-07 DIAGNOSIS — H9319 Tinnitus, unspecified ear: Secondary | ICD-10-CM

## 2012-09-07 DIAGNOSIS — H6980 Other specified disorders of Eustachian tube, unspecified ear: Secondary | ICD-10-CM

## 2012-09-07 DIAGNOSIS — R0982 Postnasal drip: Secondary | ICD-10-CM

## 2012-09-07 MED ORDER — FLUTICASONE PROPIONATE 50 MCG/ACT NA SUSP: 2.0000 | Freq: Every day | NASAL | Status: DC

## 2012-09-07 NOTE — ED Notes (Signed)
Pt c/o abnormal sound in right ear with decrease hearing. Some pain. Pt has not tried any otc meds for treatment. Symptoms present for a few days.

## 2012-09-07 NOTE — ED Provider Notes (Signed)
History     CSN: [id number]  Arrival date & time 09/07/12  1820   None     Chief Complaint  Patient presents with  . Otalgia    abnormal sound in right ear with decrease hearing.     (Consider location/radiation/quality/duration/timing/severity/associated sxs/prior treatment) HPI Comments: 32 year old female presents with discomfort in her right year for 4 days. She describes tinnitus which sounds like a fetal heart tone with Doppler. She states the ear feels stopped up in areas of diminished hearing. She denies upper respiratory congestion, PND, sore throat, fever, cough or other complaints.   History reviewed. No pertinent past medical history.  Past Surgical History  Procedure Laterality Date  . Tubal ligation    . Carpal tunnel release      History reviewed. No pertinent family history.  History  Substance Use Topics  . Smoking status: Never Smoker   . Smokeless tobacco: Not on file  . Alcohol Use: No    OB History   Grav Para Term Preterm Abortions TAB SAB Ect Mult Living                  Review of Systems  HENT:       See history of present illness  All other systems reviewed and are negative.    Allergies  Penicillins  Home Medications   Current Outpatient Rx  Name  Route  Sig  Dispense  Refill  . acetaminophen (TYLENOL) 500 MG tablet   Oral   Take 1,000 mg by mouth every 6 (six) hours as needed. For pain         . butalbital-acetaminophen-caffeine (FIORICET) 50-325-40 MG per tablet   Oral   Take 1-2 tablets by mouth every 6 (six) hours as needed for headache. Do not exceed 6 tablets in 24 hours   20 tablet   0   . fluticasone (FLONASE) 50 MCG/ACT nasal spray   Nasal   Place 2 sprays into the nose daily.   16 g   1   . HYDROcodone-acetaminophen (NORCO/VICODIN) 5-325 MG per tablet   Oral   Take 1 tablet by mouth every 4 (four) hours as needed for pain.   10 tablet   0   . omeprazole (PRILOSEC) 20 MG capsule   Oral   Take 1  capsule (20 mg total) by mouth daily.   30 capsule   0     BP 133/88  Pulse 78  Temp(Src) 99.3 F (37.4 C) (Oral)  Resp 20  SpO2 100%  LMP 08/19/2012  Physical Exam  Nursing note and vitals reviewed. Constitutional: She appears well-developed and well-nourished. No distress.  HENT:  Right TM pearly gray, entrance parent, retracted. No effusions or erythema. The EAC is clear. Left TM and EAC is normal Oropharynx with erythema mild tonsillar swelling and moderate clear PND. She is observe clearing her throat several times.  Eyes: Conjunctivae and EOM are normal.  Neck: Normal range of motion. Neck supple.  Cardiovascular: Normal rate, regular rhythm and normal heart sounds.   Pulmonary/Chest: Effort normal and breath sounds normal. No respiratory distress. She has no wheezes. She has no rales.  Lymphadenopathy:    She has no cervical adenopathy.  Neurological: She is alert.  Skin: Skin is warm and dry.    ED Course  Procedures (including critical care time)  Labs Reviewed - No data to display No results found.   1. ETD (eustachian tube dysfunction), right   2. PND (post-nasal drip)  3. Tinnitus of right ear       MDM  The patient has PND and a mild red posterior pharynx. Her right TM is retracted and I suspect this is what is causing the tinnitus and discomfort. Recommend Sudafed PE 10 mg every 4 hours when necessary Guaifenesin 100 200 mg every 4-6 hours. Fluticasone nasal spray as directed Drink plenty of fluids stay well hydrated Followup with your doctor in one week if not getting better. He may refer you to a near nose and throat doctor Instructions on tinnitus this will help explain to about white noise in his condition.        Hayden Rasmussen, NP 09/07/12 2017

## 2012-09-08 NOTE — ED Provider Notes (Signed)
Medical screening examination/treatment/procedure(s) were performed by resident physician or non-physician practitioner and as supervising physician I was immediately available for consultation/collaboration.   Barkley Bruns MD.   Linna Hoff, MD 09/08/12 [phone]

## 2012-10-11 ENCOUNTER — Encounter (HOSPITAL_COMMUNITY): Payer: Self-pay | Admitting: *Deleted

## 2012-10-11 ENCOUNTER — Emergency Department (HOSPITAL_COMMUNITY)
Admission: EM | Admit: 2012-10-11 | Discharge: 2012-10-11 | Disposition: A | Discharge status: Home / Self Care | Payer: Medicaid Other | Attending: Emergency Medicine | Admitting: Emergency Medicine

## 2012-10-11 DIAGNOSIS — Z8679 Personal history of other diseases of the circulatory system: Secondary | ICD-10-CM | POA: Insufficient documentation

## 2012-10-11 DIAGNOSIS — H938X9 Other specified disorders of ear, unspecified ear: Secondary | ICD-10-CM | POA: Insufficient documentation

## 2012-10-11 DIAGNOSIS — R110 Nausea: Secondary | ICD-10-CM | POA: Insufficient documentation

## 2012-10-11 DIAGNOSIS — R5383 Other fatigue: Secondary | ICD-10-CM | POA: Insufficient documentation

## 2012-10-11 DIAGNOSIS — R5381 Other malaise: Secondary | ICD-10-CM | POA: Insufficient documentation

## 2012-10-11 DIAGNOSIS — H6591 Unspecified nonsuppurative otitis media, right ear: Secondary | ICD-10-CM

## 2012-10-11 LAB — URINALYSIS, ROUTINE W REFLEX MICROSCOPIC
Bilirubin Urine: NEGATIVE
Glucose, UA: NEGATIVE mg/dL
Ketones, ur: NEGATIVE mg/dL
Leukocytes, UA: NEGATIVE
Nitrite: NEGATIVE
Protein, ur: NEGATIVE mg/dL
Specific Gravity, Urine: 1.015 (ref 1.005–1.030)
Urobilinogen, UA: 1 mg/dL (ref 0.0–1.0)
pH: 7 (ref 5.0–8.0)

## 2012-10-11 LAB — CBC
HCT: 45 % (ref 36.0–46.0)
Hemoglobin: 15.3 g/dL — ABNORMAL HIGH (ref 12.0–15.0)
MCH: 29.1 pg (ref 26.0–34.0)
MCHC: 34 g/dL (ref 30.0–36.0)
MCV: 85.7 fL (ref 78.0–100.0)
Platelets: 323 10*3/uL (ref 150–400)
RBC: 5.25 MIL/uL — ABNORMAL HIGH (ref 3.87–5.11)
RDW: 12.8 % (ref 11.5–15.5)
WBC: 10.9 10*3/uL — ABNORMAL HIGH (ref 4.0–10.5)

## 2012-10-11 LAB — BASIC METABOLIC PANEL
BUN: 13 mg/dL (ref 6–23)
CO2: 25 mEq/L (ref 19–32)
Calcium: 10.4 mg/dL (ref 8.4–10.5)
Chloride: 106 mEq/L (ref 96–112)
Creatinine, Ser: 0.78 mg/dL (ref 0.50–1.10)
GFR calc Af Amer: >90 mL/min (ref >90)
GFR calc non Af Amer: >90 mL/min (ref >90)
Glucose, Bld: 83 mg/dL (ref 70–99)
Potassium: 3.6 mEq/L (ref 3.5–5.1)
Sodium: 142 mEq/L (ref 135–145)

## 2012-10-11 LAB — URINE MICROSCOPIC-ADD ON

## 2012-10-11 LAB — PREGNANCY, URINE: Preg Test, Ur: NEGATIVE

## 2012-10-11 MED ORDER — KETOROLAC TROMETHAMINE 30 MG/ML IJ SOLN
30.0000 mg | Freq: Once | INTRAMUSCULAR | Status: AC
  Administered 2012-10-11: 30 mg via INTRAVENOUS
  Filled 2012-10-11: qty 1

## 2012-10-11 MED ORDER — SODIUM CHLORIDE 0.9 % IV BOLUS (SEPSIS)
1000.0000 mL | Freq: Once | INTRAVENOUS | Status: AC
  Administered 2012-10-11: 1000 mL via INTRAVENOUS

## 2012-10-11 MED ORDER — GUAIFENESIN ER 1200 MG PO TB12: 1.0000 | ORAL_TABLET | Freq: Two times a day (BID) | ORAL | Status: DC

## 2012-10-11 MED ORDER — PREDNISONE 50 MG PO TABS: 50.0000 mg | ORAL_TABLET | Freq: Every day | ORAL | Status: DC

## 2012-10-11 NOTE — ED Notes (Signed)
Pt to ED c/o hearing heartbeat  in R ear x 2 months as well as excessive fatigue.  Pt states when she holds down the artery on her R neck the sound goes away. States her grandmother had same s/s and it was a "tumor on her heart" and she lost her hearing.

## 2012-10-11 NOTE — ED Provider Notes (Signed)
History     CSN: [id number]  Arrival date & time 10/11/12  1308   First MD Initiated Contact with Patient 10/11/12 2003      Chief Complaint  Patient presents with  . Palpitations    (Consider location/radiation/quality/duration/timing/severity/associated sxs/prior treatment) HPI Kelly Haley is a 32 year old female who presents to the ED with "hearing her heartbeat in her ear". It has been ongoing for the last 2 months. She states the noise goes away when she presses on her neck. She denies tinnitus, otalgia, or drainage from the ear. She states it feels "like it is closing in" but denies a pressure or congestion sensation. She states the noise is constant and gets louder and more high-pitched with movement. She denies chest pain, palpitations, feeling lightheaded or vertigo-like symptoms. She also reports having cervical neck pain for the past 2 weeks. She states it feels like she has a "crick in her neck like she slept funny" but that the pain hasn't gone away. She denies and neck stiffness or decrease in her neck ROM. She has taken some Tylenol and had mild relief of the pain. The pain does not radiate anywhere and is constant. For the last 2 days she has had a constant headache and some fatigue. She states the headache came on gradually but has not gone away. She reports having migraine-like headaches in the past and that this headache feels different. She states it had a gradual onset and is a tightness sensation to her forehead and along her temples. She reports some nausea but denies photophobia and phonophobia. She has also felt fatigued and reports sleeping more the last 2 days than she normally does. She denies fevers, chills, cough, coryza, skin changes, hair changes, heat and cold intolerance.  History reviewed. No pertinent past medical history.  Past Surgical History  Procedure Laterality Date  . Tubal ligation    . Carpal tunnel release      No family history on  file.  History  Substance Use Topics  . Smoking status: Never Smoker   . Smokeless tobacco: Not on file  . Alcohol Use: No    OB History   Grav Para Term Preterm Abortions TAB SAB Ect Mult Living                  Review of Systems All other systems negative except as documented in the HPI. All pertinent positives and negatives as reviewed in the HPI.  Allergies  Penicillins  Home Medications   Current Outpatient Rx  Name  Route  Sig  Dispense  Refill  . acetaminophen (TYLENOL) 500 MG tablet   Oral   Take 1,000 mg by mouth every 6 (six) hours as needed. For pain           BP 123/92  Pulse 100  Temp(Src) 98.5 F (36.9 C) (Oral)  Resp 18  SpO2 98%  LMP 10/09/2012  Physical Exam  Nursing note and vitals reviewed. Constitutional: She appears well-developed and well-nourished. No distress.  Overweight  HENT:  Head: Normocephalic and atraumatic.  Right Ear: Tympanic membrane and external ear normal. No drainage, swelling or tenderness.  Mouth/Throat: Oropharynx is clear and moist.  Eyes: Pupils are equal, round, and reactive to light.  Neck: Trachea normal and normal range of motion. Neck supple. No JVD present. No tracheal tenderness present. Carotid bruit is not present. No tracheal deviation, no edema and no erythema present. No mass and no thyromegaly present.  Pain elicited with  rotation to the left and left lateral bending of the neck. No tenderness to palpation of the cervical spine, paravertebral muscles or lateral neck.  Cardiovascular: Normal rate, regular rhythm and normal heart sounds.  Exam reveals no gallop and no friction rub.   No murmur heard. Pulmonary/Chest: Effort normal and breath sounds normal. No stridor. No respiratory distress.  Abdominal: Bowel sounds are normal.  Lymphadenopathy:    She has no cervical adenopathy.  Neurological: She is alert.  Skin: Skin is warm and dry. No rash noted.    ED Course  Procedures (including critical care  time)  Labs Reviewed  CBC  BASIC METABOLIC PANEL  URINALYSIS, ROUTINE W REFLEX MICROSCOPIC  PREGNANCY, URINE  The patient has no neck trauma, no bruits, no visual changes, numbness, weakness, or syncope. The patient could have some eustachian tube dysfunction. The patient will be again referred to ENT. The patient has no signs of significant vascular issue at this point.   MDM  MDM Reviewed: nursing note and vitals Reviewed previous: ECG Interpretation: labs, ECG and x-ray    Date: 10/12/2012  Rate: 86  Rhythm: normal sinus rhythm  QRS Axis: normal  Intervals: normal  ST/T Wave abnormalities: nonspecific ST/T changes  Conduction Disutrbances:none  Narrative Interpretation:   Old EKG Reviewed: unchanged          Carlyle Dolly, PA-C 10/11/12 2304  Carlyle Dolly, PA-C 10/12/12 0010

## 2012-10-12 NOTE — ED Provider Notes (Signed)
Medical screening examination/treatment/procedure(s) were conducted as a shared visit with non-physician practitioner(s) and myself.  I personally evaluated the patient during the encounter  Pt well appearing,no distress, she has no focal neuro deficits EKG reviewed and is unchanged Stable for d/c  Joya Gaskins, MD 10/12/12 1104

## 2012-12-07 ENCOUNTER — Ambulatory Visit: Payer: Self-pay | Admitting: Orthopedic Surgery

## 2013-01-25 ENCOUNTER — Emergency Department (HOSPITAL_COMMUNITY)
Admission: EM | Admit: 2013-01-25 | Discharge: 2013-01-25 | Discharge status: Against Medical Advice | Payer: Medicaid Other | Attending: Emergency Medicine | Admitting: Emergency Medicine

## 2013-01-25 ENCOUNTER — Emergency Department (INDEPENDENT_AMBULATORY_CARE_PROVIDER_SITE_OTHER)
Admission: EM | Admit: 2013-01-25 | Discharge: 2013-01-25 | Disposition: A | Discharge status: Nursing Home (Includes ICF) | Payer: Medicaid Other | Source: Home / Self Care

## 2013-01-25 ENCOUNTER — Encounter (HOSPITAL_COMMUNITY): Payer: Self-pay | Admitting: Emergency Medicine

## 2013-01-25 ENCOUNTER — Encounter (HOSPITAL_COMMUNITY): Payer: Self-pay

## 2013-01-25 DIAGNOSIS — M549 Dorsalgia, unspecified: Secondary | ICD-10-CM

## 2013-01-25 DIAGNOSIS — R109 Unspecified abdominal pain: Secondary | ICD-10-CM | POA: Insufficient documentation

## 2013-01-25 LAB — CBC WITH DIFFERENTIAL/PLATELET
Basophils Absolute: 0.1 10*3/uL (ref 0.0–0.1)
Basophils Relative: 1 % (ref 0–1)
Eosinophils Absolute: 0.2 10*3/uL (ref 0.0–0.7)
Eosinophils Relative: 2 % (ref 0–5)
MCH: 28.9 pg (ref 26.0–34.0)
MCV: 84.6 fL (ref 78.0–100.0)
Platelets: 308 10*3/uL (ref 150–400)
RDW: 12.8 % (ref 11.5–15.5)
WBC: 9.5 10*3/uL (ref 4.0–10.5)

## 2013-01-25 LAB — POCT URINALYSIS DIP (DEVICE)
Hgb urine dipstick: NEGATIVE
Ketones, ur: NEGATIVE mg/dL
Protein, ur: NEGATIVE mg/dL
Specific Gravity, Urine: 1.02 (ref 1.005–1.030)

## 2013-01-25 LAB — COMPREHENSIVE METABOLIC PANEL
AST: 22 U/L (ref 0–37)
Albumin: 4.1 g/dL (ref 3.5–5.2)
Alkaline Phosphatase: 82 U/L (ref 39–117)
BUN: 14 mg/dL (ref 6–23)
CO2: 25 mEq/L (ref 19–32)
Chloride: 105 mEq/L (ref 96–112)
Potassium: 3.7 mEq/L (ref 3.5–5.1)
Total Bilirubin: 0.4 mg/dL (ref 0.3–1.2)

## 2013-01-25 LAB — POCT PREGNANCY, URINE: Preg Test, Ur: NEGATIVE

## 2013-01-25 NOTE — ED Notes (Signed)
Pt c/o lower back pain onset 2 days... Pain radiates around sides to abd/pelvic; sxs also include freq urination... Denies: dysuria, hematuria, fevers, vag d/c, inj/trauma... She is alert w/no signs of acute distress.

## 2013-01-25 NOTE — ED Provider Notes (Signed)
   History    CSN: [id number] Arrival date & time 01/25/13  1417  None    Chief Complaint  Patient presents with  . Back Pain   (Consider location/radiation/quality/duration/timing/severity/associated sxs/prior Treatment) HPI  32 yo wf presents with low back pain, abd pain, and increased urinary frequency.  Symptoms ongoing for 2 days.  Denies fever, chills, nausea, vomiting, dysuria, hematuria, vaginal bleeding/discharge.  Also has bilat flank and mid back pain.  No injury or le radicular symptoms.   History reviewed. No pertinent past medical history. Past Surgical History  Procedure Laterality Date  . Tubal ligation    . Carpal tunnel release     No family history on file. History  Substance Use Topics  . Smoking status: Never Smoker   . Smokeless tobacco: Not on file  . Alcohol Use: No   OB History   Grav Para Term Preterm Abortions TAB SAB Ect Mult Living                 Review of Systems  Constitutional: Negative.   HENT: Negative.   Eyes: Negative.   Respiratory: Negative.   Cardiovascular: Negative.   Gastrointestinal: Positive for abdominal pain. Negative for nausea, vomiting, diarrhea and constipation.  Endocrine: Negative.   Genitourinary: Positive for frequency, flank pain and pelvic pain. Negative for dysuria, urgency, hematuria, decreased urine volume, vaginal bleeding, vaginal discharge, difficulty urinating and genital sores.  Skin: Negative.   Neurological: Negative.   Psychiatric/Behavioral: Negative.     Allergies  Penicillins  Home Medications   Current Outpatient Rx  Name  Route  Sig  Dispense  Refill  . acetaminophen (TYLENOL) 500 MG tablet   Oral   Take 1,000 mg by mouth every 6 (six) hours as needed. For pain         . Guaifenesin 1200 MG TB12   Oral   Take 1 tablet (1,200 mg total) by mouth 2 (two) times daily.   20 each   0   . predniSONE (DELTASONE) 50 MG tablet   Oral   Take 1 tablet (50 mg total) by mouth daily.   5  tablet   0    BP 130/64  Pulse 70  Temp(Src) 98 F (36.7 C) (Oral)  Resp 16  LMP 01/25/2013 Physical Exam  Constitutional: She is oriented to person, place, and time. She appears well-developed and well-nourished.  HENT:  Head: Normocephalic and atraumatic.  Eyes: EOM are normal. Pupils are equal, round, and reactive to light.  Neck: Normal range of motion.  Cardiovascular: Normal rate, regular rhythm and normal heart sounds.   Pulmonary/Chest: Effort normal and breath sounds normal.  Abdominal: Soft. Bowel sounds are normal. There is tenderness.  Musculoskeletal: Normal range of motion.  Tender over lumbar spinous processes.   No CVA tenderness.    Neurological: She is alert and oriented to person, place, and time.  Skin: Skin is warm and dry.  Psychiatric: She has a normal mood and affect.    ED Course  Procedures (including critical care time) Labs Reviewed  POCT URINALYSIS DIP (DEVICE)  POCT PREGNANCY, URINE   No results found. 1. Abdominal pain   2.  Back pain 3.  Increased urinary frequency.  MDM  Will transfer patient to ED for further evaluation and treatment.  Discussed with dr Artis Flock.  Patient voices understanding.   Zonia Kief, PA-C 01/25/13 1627

## 2013-01-25 NOTE — ED Notes (Signed)
Pt c/o lower abd pain, lower back pain, urine frequency, pressure in lower abd when the urge to urinate would come on but denies burning or pain with urination x3 days. Pt denies N/V/D. Pt seen at [facility] and sent here for further evaluation, UA obtained and came back with no signs of possible UTI

## 2013-01-25 NOTE — ED Notes (Signed)
Unable to locate pt x1

## 2013-01-26 NOTE — ED Provider Notes (Signed)
Medical screening examination/treatment/procedure(s) were performed by resident physician or non-physician practitioner and as supervising physician I was immediately available for consultation/collaboration.   KINDL,JAMES DOUGLAS MD.   James D Kindl, MD 01/26/13 1639 

## 2013-02-06 ENCOUNTER — Emergency Department (HOSPITAL_COMMUNITY)
Admission: EM | Admit: 2013-02-06 | Discharge: 2013-02-06 | Disposition: A | Discharge status: Home / Self Care | Payer: Medicaid Other | Attending: Emergency Medicine | Admitting: Emergency Medicine

## 2013-02-06 ENCOUNTER — Encounter (HOSPITAL_COMMUNITY): Payer: Self-pay | Admitting: *Deleted

## 2013-02-06 DIAGNOSIS — N390 Urinary tract infection, site not specified: Secondary | ICD-10-CM

## 2013-02-06 DIAGNOSIS — Z880 Allergy status to penicillin: Secondary | ICD-10-CM | POA: Insufficient documentation

## 2013-02-06 DIAGNOSIS — R3915 Urgency of urination: Secondary | ICD-10-CM | POA: Insufficient documentation

## 2013-02-06 DIAGNOSIS — Z3202 Encounter for pregnancy test, result negative: Secondary | ICD-10-CM | POA: Insufficient documentation

## 2013-02-06 DIAGNOSIS — M549 Dorsalgia, unspecified: Secondary | ICD-10-CM | POA: Insufficient documentation

## 2013-02-06 LAB — URINALYSIS, ROUTINE W REFLEX MICROSCOPIC
Ketones, ur: NEGATIVE mg/dL
Nitrite: NEGATIVE
Protein, ur: NEGATIVE mg/dL

## 2013-02-06 LAB — URINE MICROSCOPIC-ADD ON

## 2013-02-06 MED ORDER — METHOCARBAMOL 750 MG PO TABS: 750.0000 mg | ORAL_TABLET | Freq: Four times a day (QID) | ORAL | Status: DC

## 2013-02-06 MED ORDER — OXYCODONE-ACETAMINOPHEN 5-325 MG PO TABS: 2.0000 | ORAL_TABLET | ORAL | Status: DC | PRN

## 2013-02-06 MED ORDER — SULFAMETHOXAZOLE-TRIMETHOPRIM 800-160 MG PO TABS: 1.0000 | ORAL_TABLET | Freq: Two times a day (BID) | ORAL | Status: DC

## 2013-02-06 NOTE — ED Notes (Addendum)
Pt reports abdominal pain, intermittently on right and left side. Pain 6/10. Also reports lower back pain. Pt reports nausea and frequency. Pt states "when i have to pee i have pressure" around pelvic area. Denies dysuria.   Reports went to urgent care 7/10, was transported to [facility] ED but was unable to stay, states she needed to get home to her kids.

## 2013-02-06 NOTE — ED Provider Notes (Signed)
   History    CSN: [id number] Arrival date & time 02/06/13  0946  First MD Initiated Contact with Patient 02/06/13 [phone]     Chief Complaint  Patient presents with  . Abdominal Pain   (Consider location/radiation/quality/duration/timing/severity/associated sxs/prior Treatment) Patient is a 32 y.o. female presenting with abdominal pain. The history is provided by the patient.  Abdominal Pain Associated symptoms include abdominal pain.   patient complaining of left-sided back pain x3 weeks worse today. Also notes urinary urgency without dysuria. Denies hematuria dysuria. No fever chills or vomiting. Seen in urgent care Center 10 days ago and had a urinalysis that was negative. No rashes appreciated. Pain is worse with standing and certain movements. Patient Has used over-the-counter medications without relief. She denies any prior history of chronic back History reviewed. No pertinent past medical history. Past Surgical History  Procedure Laterality Date  . Tubal ligation    . Carpal tunnel release     History reviewed. No pertinent family history. History  Substance Use Topics  . Smoking status: Never Smoker   . Smokeless tobacco: Not on file  . Alcohol Use: No   OB History   Grav Para Term Preterm Abortions TAB SAB Ect Mult Living                 Review of Systems  Gastrointestinal: Positive for abdominal pain.  All other systems reviewed and are negative.    Allergies  Penicillins  Home Medications  No current outpatient prescriptions on file. BP 124/86  Pulse 98  Temp(Src) 98.6 F (37 C) (Oral)  Resp 16  SpO2 100%  LMP 01/25/2013 Physical Exam  Nursing note and vitals reviewed. Constitutional: She is oriented to person, place, and time. She appears well-developed and well-nourished.  Non-toxic appearance. No distress.  HENT:  Head: Normocephalic and atraumatic.  Eyes: Conjunctivae, EOM and lids are normal. Pupils are equal, round, and reactive to light.    Neck: Normal range of motion. Neck supple. No tracheal deviation present. No mass present.  Cardiovascular: Normal rate, regular rhythm and normal heart sounds.  Exam reveals no gallop.   No murmur heard. Pulmonary/Chest: Effort normal and breath sounds normal. No stridor. No respiratory distress. She has no decreased breath sounds. She has no wheezes. She has no rhonchi. She has no rales.  Abdominal: Soft. Normal appearance and bowel sounds are normal. She exhibits no distension. There is no tenderness. There is no rigidity, no rebound, no guarding and no CVA tenderness.  Musculoskeletal: Normal range of motion. She exhibits no edema and no tenderness.       Lumbar back: She exhibits bony tenderness.       Arms: Neurological: She is alert and oriented to person, place, and time. She has normal strength. No cranial nerve deficit or sensory deficit. GCS eye subscore is 4. GCS verbal subscore is 5. GCS motor subscore is 6.  Skin: Skin is warm and dry. No abrasion and no rash noted.  Psychiatric: She has a normal mood and affect. Her speech is normal and behavior is normal.    ED Course  Procedures (including critical care time) Labs Reviewed  URINALYSIS, ROUTINE W REFLEX MICROSCOPIC   No results found. No diagnosis found.  MDM  Patient with possible early UTI and we'll treat her with antibiotics  Toy Baker, MD 02/06/13 1156

## 2013-03-26 ENCOUNTER — Encounter (HOSPITAL_COMMUNITY): Payer: Self-pay

## 2013-03-26 ENCOUNTER — Inpatient Hospital Stay (HOSPITAL_COMMUNITY)
Admission: AD | Admit: 2013-03-26 | Discharge: 2013-03-26 | Disposition: A | Discharge status: Home / Self Care | Payer: Medicaid Other | Source: Ambulatory Visit | Attending: Obstetrics & Gynecology | Admitting: Obstetrics & Gynecology

## 2013-03-26 DIAGNOSIS — N940 Mittelschmerz: Secondary | ICD-10-CM | POA: Insufficient documentation

## 2013-03-26 DIAGNOSIS — R109 Unspecified abdominal pain: Secondary | ICD-10-CM | POA: Insufficient documentation

## 2013-03-26 HISTORY — DX: Other specified health status: Z78.9

## 2013-03-26 LAB — CBC
MCH: 27.8 pg (ref 26.0–34.0)
MCV: 84.3 fL (ref 78.0–100.0)
Platelets: 293 10*3/uL (ref 150–400)
RDW: 12.9 % (ref 11.5–15.5)

## 2013-03-26 LAB — URINALYSIS, ROUTINE W REFLEX MICROSCOPIC
Bilirubin Urine: NEGATIVE
Glucose, UA: NEGATIVE mg/dL
Hgb urine dipstick: NEGATIVE
Ketones, ur: NEGATIVE mg/dL
Leukocytes, UA: NEGATIVE
Nitrite: NEGATIVE
Protein, ur: NEGATIVE mg/dL
Specific Gravity, Urine: 1.02 (ref 1.005–1.030)
Urobilinogen, UA: 0.2 mg/dL (ref 0.0–1.0)
pH: 6.5 (ref 5.0–8.0)

## 2013-03-26 LAB — POCT PREGNANCY, URINE: Preg Test, Ur: NEGATIVE

## 2013-03-26 LAB — WET PREP, GENITAL
Trich, Wet Prep: NONE SEEN
Yeast Wet Prep HPF POC: NONE SEEN

## 2013-03-26 MED ORDER — IBUPROFEN 600 MG PO TABS: 600.0000 mg | ORAL_TABLET | Freq: Four times a day (QID) | ORAL | Status: DC | PRN

## 2013-03-26 MED ORDER — KETOROLAC TROMETHAMINE 60 MG/2ML IM SOLN
60.0000 mg | Freq: Once | INTRAMUSCULAR | Status: AC
  Administered 2013-03-26: 60 mg via INTRAMUSCULAR
  Filled 2013-03-26: qty 2

## 2013-03-26 NOTE — MAU Provider Note (Signed)
History     CSN: [id number]  Arrival date and time: 03/26/13 1027   First Provider Initiated Contact with Patient 03/26/13 1132      Chief Complaint  Patient presents with  . Abdominal Pain   HPI  [name] is a 32 y.o. [phone] non-pregnant female who presents with abdominal pain that started 1 week ago; pain is just below her belly button, constant, feels like a contraction, sharp, lasts for a few seconds and then goes away. Denies abnormal discharge or fever. Pt has not taken anything for pain today. Pt has had the same partner for 2 years; no STI testing recently. Patient rates her pain a 6/10 at this time.   Pt reports LMP two weeks ago.  OB History   Grav Para Term Preterm Abortions TAB SAB Ect Mult Living   4 3 3  1 1    3       Past Medical History  Diagnosis Date  . Medical history non-contributory     Past Surgical History  Procedure Laterality Date  . Tubal ligation    . Carpal tunnel release      History reviewed. No pertinent family history.  History  Substance Use Topics  . Smoking status: Current Some Day Smoker  . Smokeless tobacco: Never Used  . Alcohol Use: No    Allergies:  Allergies  Allergen Reactions  . Penicillins Hives and Nausea And Vomiting    Prescriptions prior to admission  Medication Sig Dispense Refill  . methocarbamol (ROBAXIN-750) 750 MG tablet Take 1 tablet (750 mg total) by mouth 4 (four) times daily.  30 tablet  0  . oxyCODONE-acetaminophen (PERCOCET/ROXICET) 5-325 MG per tablet Take 2 tablets by mouth every 4 (four) hours as needed for pain.  10 tablet  0  . sulfamethoxazole-trimethoprim (SEPTRA DS) 800-160 MG per tablet Take 1 tablet by mouth every 12 (twelve) hours.  10 tablet  0   Results for orders placed during the hospital encounter of 03/26/13 (from the past 24 hour(s))  POCT PREGNANCY, URINE     Status: None   Collection Time    03/26/13 10:44 AM      Result Value Range   Preg Test, Ur NEGATIVE   NEGATIVE  URINALYSIS, ROUTINE W REFLEX MICROSCOPIC     Status: None   Collection Time    03/26/13 10:55 AM      Result Value Range   Color, Urine YELLOW  YELLOW   APPearance CLEAR  CLEAR   Specific Gravity, Urine 1.020  1.005 - 1.030   pH 6.5  5.0 - 8.0   Glucose, UA NEGATIVE  NEGATIVE mg/dL   Hgb urine dipstick NEGATIVE  NEGATIVE   Bilirubin Urine NEGATIVE  NEGATIVE   Ketones, ur NEGATIVE  NEGATIVE mg/dL   Protein, ur NEGATIVE  NEGATIVE mg/dL   Urobilinogen, UA 0.2  0.0 - 1.0 mg/dL   Nitrite NEGATIVE  NEGATIVE   Leukocytes, UA NEGATIVE  NEGATIVE    Review of Systems  Constitutional: Negative for fever and chills.  Gastrointestinal: Positive for nausea and abdominal pain. Negative for vomiting, diarrhea and constipation.       Suprapubic, just below belly button   Genitourinary: Negative for dysuria, urgency, frequency and hematuria.  Neurological: Negative for headaches.   Physical Exam   Blood pressure 106/61, pulse 76, temperature 98.5 F (36.9 C), temperature source Oral, resp. rate 16, last menstrual period 03/12/2013, SpO2 99.00%.  Physical Exam  Constitutional: She is oriented to person,  place, and time. She appears well-developed and well-nourished. No distress.  HENT:  Head: Normocephalic.  Neck: Neck supple.  GI: Soft. There is tenderness.  Right abdominal tenderness Suprapubic tenderness   Genitourinary: No vaginal discharge found.  Speculum exam: Vagina - Small amount of creamy/clear discharge, no odor Cervix - No contact bleeding Bimanual exam: Cervix closed Uterus non tender, normal size Right  Adnexal tenderness, no masses bilaterally GC/Chlam, wet prep done Chaperone present for exam.   Neurological: She is alert and oriented to person, place, and time.  Skin: Skin is warm and dry. She is not diaphoretic.    MAU Course  Procedures None  MDM UA UPT  Wet prep GC/Chlamydia: pending  60 mg IM of Toradol times 1 in MAU    Assessment and  Plan    Report given to Arbie Cookey CNM who resumes care of the patient  Debbrah Alar FNP-C 03/26/2013, 12:31 PM   Results for orders placed during the hospital encounter of 03/26/13 (from the past 24 hour(s))  POCT PREGNANCY, URINE     Status: None   Collection Time    03/26/13 10:44 AM      Result Value Range   Preg Test, Ur NEGATIVE  NEGATIVE  URINALYSIS, ROUTINE W REFLEX MICROSCOPIC     Status: None   Collection Time    03/26/13 10:55 AM      Result Value Range   Color, Urine YELLOW  YELLOW   APPearance CLEAR  CLEAR   Specific Gravity, Urine 1.020  1.005 - 1.030   pH 6.5  5.0 - 8.0   Glucose, UA NEGATIVE  NEGATIVE mg/dL   Hgb urine dipstick NEGATIVE  NEGATIVE   Bilirubin Urine NEGATIVE  NEGATIVE   Ketones, ur NEGATIVE  NEGATIVE mg/dL   Protein, ur NEGATIVE  NEGATIVE mg/dL   Urobilinogen, UA 0.2  0.0 - 1.0 mg/dL   Nitrite NEGATIVE  NEGATIVE   Leukocytes, UA NEGATIVE  NEGATIVE  WET PREP, GENITAL     Status: Abnormal   Collection Time    03/26/13 12:20 PM      Result Value Range   Yeast Wet Prep HPF POC NONE SEEN  NONE SEEN   Trich, Wet Prep NONE SEEN  NONE SEEN   Clue Cells Wet Prep HPF POC NONE SEEN  NONE SEEN   WBC, Wet Prep HPF POC FEW (*) NONE SEEN  CBC     Status: None   Collection Time    03/26/13  1:29 PM      Result Value Range   WBC 8.3  4.0 - 10.5 K/uL   RBC 4.85  3.87 - 5.11 MIL/uL   Hemoglobin 13.5  12.0 - 15.0 g/dL   HCT 16.1  09.6 - 04.5 %   MCV 84.3  78.0 - 100.0 fL   MCH 27.8  26.0 - 34.0 pg   MCHC 33.0  30.0 - 36.0 g/dL   RDW 40.9  81.1 - 91.4 %   Platelets 293  150 - 400 K/uL   A: 1. Ovulatory pain     P: D/C home Ibuprofen 600 mg Q 6 hours Return to MAU if symptoms persist or worsen  Sharen Counter Certified Nurse-Midwife

## 2013-03-26 NOTE — MAU Note (Signed)
Pain noted x1 week.

## 2013-03-26 NOTE — MAU Note (Signed)
Pt states here for abdominal pain. States pain began in upper abdomen, points to epigastric area, now moved down to lower pelvic area. Hurts when she pushes her stomach in. LMP-2 weeks ago, periods are usually normally one week long, however had spotting a few days after going off her cycle. Did have bleeding after sex then as well, which has not happened before. Same partner x2 years. Denies vaginal discharge. Abdominal pain is constant, very sharp, intermittent, however has constant duller pain there also.

## 2013-03-27 LAB — GC/CHLAMYDIA PROBE AMP: CT Probe RNA: NEGATIVE

## 2013-05-21 ENCOUNTER — Emergency Department: Payer: Self-pay | Admitting: Emergency Medicine

## 2013-05-31 ENCOUNTER — Ambulatory Visit: Payer: Self-pay | Admitting: Otolaryngology

## 2013-10-25 ENCOUNTER — Emergency Department (INDEPENDENT_AMBULATORY_CARE_PROVIDER_SITE_OTHER)
Admission: EM | Admit: 2013-10-25 | Discharge: 2013-10-25 | Disposition: A | Discharge status: Home / Self Care | Payer: Self-pay | Source: Home / Self Care | Attending: Family Medicine | Admitting: Family Medicine

## 2013-10-25 ENCOUNTER — Encounter (HOSPITAL_COMMUNITY): Payer: Self-pay | Admitting: Emergency Medicine

## 2013-10-25 DIAGNOSIS — R51 Headache: Secondary | ICD-10-CM

## 2013-10-25 DIAGNOSIS — R519 Headache, unspecified: Secondary | ICD-10-CM

## 2013-10-25 DIAGNOSIS — J069 Acute upper respiratory infection, unspecified: Secondary | ICD-10-CM

## 2013-10-25 NOTE — ED Provider Notes (Signed)
CSN: [id number]     Arrival date & time 10/25/13  1438 History   First MD Initiated Contact with Patient 10/25/13 1612     Chief Complaint  Patient presents with  . Headache  . URI   (Consider location/radiation/quality/duration/timing/severity/associated sxs/prior Treatment) Patient is a 33 y.o. female presenting with headaches and URI. The history is provided by the patient.  Headache Pain location:  Frontal Quality: pressure. Radiates to:  Does not radiate Severity currently:  6/10 Onset quality:  Gradual Duration:  3 days Timing:  Constant Progression:  Worsening Chronicity:  New Context: not activity, not exposure to bright light, not eating, not stress and not loud noise   Relieved by:  Nothing Worsened by:  Nothing tried Ineffective treatments:  Acetaminophen and NSAIDs Associated symptoms: congestion, drainage, sinus pressure and URI   Associated symptoms: no blurred vision, no cough, no ear pain, no facial pain, no fever, no nausea, no numbness, no photophobia, no sore throat, no tingling, no visual change, no vomiting and no weakness   URI Presenting symptoms: congestion   Presenting symptoms: no cough, no ear pain, no facial pain, no fever, no rhinorrhea and no sore throat   Severity:  Moderate Onset quality:  Gradual Duration:  3 days Timing:  Constant Progression:  Unchanged Chronicity:  New Relieved by:  Nothing Worsened by:  Nothing tried Ineffective treatments:  OTC medications Associated symptoms: headaches     Past Medical History  Diagnosis Date  . Medical history non-contributory    Past Surgical History  Procedure Laterality Date  . Tubal ligation    . Carpal tunnel release     History reviewed. No pertinent family history. History  Substance Use Topics  . Smoking status: Current Some Day Smoker  . Smokeless tobacco: Never Used  . Alcohol Use: No   OB History   Grav Para Term Preterm Abortions TAB SAB Ect Mult Living   4 3 3  1 1    3       Review of Systems  Constitutional: Negative for fever and chills.  HENT: Positive for congestion, postnasal drip and sinus pressure. Negative for ear pain, rhinorrhea and sore throat.   Eyes: Negative for blurred vision, photophobia and visual disturbance.  Respiratory: Negative for cough.   Gastrointestinal: Negative for nausea and vomiting.  Neurological: Positive for headaches. Negative for numbness.    Allergies  Penicillins  Home Medications   Current Outpatient Rx  Name  Route  Sig  Dispense  Refill  . acetaminophen (TYLENOL) 500 MG tablet   Oral   Take 1,000 mg by mouth every 6 (six) hours as needed for pain.         Marland Kitchen. ibuprofen (ADVIL,MOTRIN) 200 MG tablet   Oral   Take 600 mg by mouth every 6 (six) hours as needed for pain.         Marland Kitchen. ibuprofen (ADVIL,MOTRIN) 600 MG tablet   Oral   Take 1 tablet (600 mg total) by mouth every 6 (six) hours as needed for pain.   30 tablet   0    BP 120/80  Pulse 79  Temp(Src) 98.2 F (36.8 C) (Oral)  Resp 12  SpO2 100%  LMP 09/24/2013 Physical Exam  Constitutional: She appears well-developed and well-nourished. No distress.  HENT:  Right Ear: Tympanic membrane, external ear and ear canal normal.  Left Ear: Tympanic membrane, external ear and ear canal normal.  Nose: Rhinorrhea present. No mucosal edema. Right sinus exhibits frontal sinus tenderness. Right  sinus exhibits no maxillary sinus tenderness. Left sinus exhibits frontal sinus tenderness. Left sinus exhibits no maxillary sinus tenderness.  Mouth/Throat: Uvula is midline, oropharynx is clear and moist and mucous membranes are normal.  Cardiovascular: Normal rate and regular rhythm.   Pulmonary/Chest: Effort normal and breath sounds normal.  Lymphadenopathy:       Head (right side): No submental, no submandibular and no tonsillar adenopathy present.       Head (left side): No submental, no submandibular and no tonsillar adenopathy present.    She has no cervical  adenopathy.    ED Course  Procedures (including critical care time) Labs Review Labs Reviewed - No data to display Imaging Review No results found.   MDM   1. URI (upper respiratory infection)   2. Sinus headache   congestion causing headache could be allergic or viral. Pt to get sudafed and take, and get saline nasal spray and use frequently. Continue tylenol or ibuprofen if needed for pain.     Cathlyn Parsons, NP 10/25/13 [phone]

## 2013-10-25 NOTE — ED Notes (Signed)
Really bad headache for approx 3-4 days.  Patient reports increase in headaches recently.  Reports nasal stuffiness and coughing, intermittent scratchy throat

## 2013-10-25 NOTE — ED Provider Notes (Signed)
Medical screening examination/treatment/procedure(s) were performed by resident physician or non-physician practitioner and as supervising physician I was immediately available for consultation/collaboration.   KINDL,JAMES DOUGLAS MD.   James D Kindl, MD 10/25/13 [id number] 

## 2013-10-25 NOTE — Discharge Instructions (Signed)
Purchase Sudafed (or generic pseudoephedrine) from behind the pharmacy counter. Take as directed. Also purchase saline nasal spray and use it frequently to help your sinuses drain. Drink LOTS of water to thin out your congestion.    Viral Infections A viral infection can be caused by different types of viruses.Most viral infections are not serious and resolve on their own. However, some infections may cause severe symptoms and may lead to further complications. SYMPTOMS Viruses can frequently cause:  Minor sore throat.  Aches and pains.  Headaches.  Runny nose.  Different types of rashes.  Watery eyes.  Tiredness.  Cough.  Loss of appetite.  Gastrointestinal infections, resulting in nausea, vomiting, and diarrhea. These symptoms do not respond to antibiotics because the infection is not caused by bacteria. However, you might catch a bacterial infection following the viral infection. This is sometimes called a "superinfection." Symptoms of such a bacterial infection may include:  Worsening sore throat with pus and difficulty swallowing.  Swollen neck glands.  Chills and a high or persistent fever.  Severe headache.  Tenderness over the sinuses.  Persistent overall ill feeling (malaise), muscle aches, and tiredness (fatigue).  Persistent cough.  Yellow, green, or brown mucus production with coughing. HOME CARE INSTRUCTIONS   Only take over-the-counter or prescription medicines for pain, discomfort, diarrhea, or fever as directed by your caregiver.  Drink enough water and fluids to keep your urine clear or pale yellow. Sports drinks can provide valuable electrolytes, sugars, and hydration.  Get plenty of rest and maintain proper nutrition. Soups and broths with crackers or rice are fine. SEEK IMMEDIATE MEDICAL CARE IF:   You have severe headaches, shortness of breath, chest pain, neck pain, or an unusual rash.  You have uncontrolled vomiting, diarrhea, or you are  unable to keep down fluids.  You or your child has an oral temperature above 102 F (38.9 C), not controlled by medicine.  Your baby is older than 3 months with a rectal temperature of 102 F (38.9 C) or higher.  Your baby is 343 months old or younger with a rectal temperature of 100.4 F (38 C) or higher. MAKE SURE YOU:   Understand these instructions.  Will watch your condition.  Will get help right away if you are not doing well or get worse. Document Released: 04/14/2005 Document Revised: 09/27/2011 Document Reviewed: 11/09/2010 [facility]ExitCare Patient Information 2014 [place]Care, [place].

## 2013-12-13 ENCOUNTER — Emergency Department (INDEPENDENT_AMBULATORY_CARE_PROVIDER_SITE_OTHER)
Admission: EM | Admit: 2013-12-13 | Discharge: 2013-12-13 | Disposition: A | Discharge status: Home / Self Care | Payer: Self-pay | Source: Home / Self Care | Attending: Emergency Medicine | Admitting: Emergency Medicine

## 2013-12-13 ENCOUNTER — Encounter (HOSPITAL_COMMUNITY): Payer: Self-pay | Admitting: Emergency Medicine

## 2013-12-13 DIAGNOSIS — J029 Acute pharyngitis, unspecified: Secondary | ICD-10-CM

## 2013-12-13 LAB — POCT RAPID STREP A: STREPTOCOCCUS, GROUP A SCREEN (DIRECT): NEGATIVE

## 2013-12-13 MED ORDER — PREDNISONE 20 MG PO TABS: 20.0000 mg | ORAL_TABLET | Freq: Two times a day (BID) | ORAL | Status: DC

## 2013-12-13 MED ORDER — HYDROCODONE-ACETAMINOPHEN 5-325 MG PO TABS: ORAL_TABLET | ORAL | Status: DC

## 2013-12-13 MED ORDER — AZITHROMYCIN 500 MG PO TABS: 500.0000 mg | ORAL_TABLET | Freq: Every day | ORAL | Status: DC

## 2013-12-13 NOTE — Discharge Instructions (Signed)
Pharyngitis °Pharyngitis is redness, pain, and swelling (inflammation) of your pharynx.  °CAUSES  °Pharyngitis is usually caused by infection. Most of the time, these infections are from viruses (viral) and are part of a cold. However, sometimes pharyngitis is caused by bacteria (bacterial). Pharyngitis can also be caused by allergies. Viral pharyngitis may be spread from person to person by coughing, sneezing, and personal items or utensils (cups, forks, spoons, toothbrushes). Bacterial pharyngitis may be spread from person to person by more intimate contact, such as kissing.  °SIGNS AND SYMPTOMS  °Symptoms of pharyngitis include:   °· Sore throat.   °· Tiredness (fatigue).   °· Low-grade fever.   °· Headache. °· Joint pain and muscle aches. °· Skin rashes. °· Swollen lymph nodes. °· Plaque-like film on throat or tonsils (often seen with bacterial pharyngitis). °DIAGNOSIS  °Your health care provider will ask you questions about your illness and your symptoms. Your medical history, along with a physical exam, is often all that is needed to diagnose pharyngitis. Sometimes, a rapid strep test is done. Other lab tests may also be done, depending on the suspected cause.  °TREATMENT  °Viral pharyngitis will usually get better in 3 4 days without the use of medicine. Bacterial pharyngitis is treated with medicines that kill germs (antibiotics).  °HOME CARE INSTRUCTIONS  °· Drink enough water and fluids to keep your urine clear or pale yellow.   °· Only take over-the-counter or prescription medicines as directed by your health care provider:   °· If you are prescribed antibiotics, make sure you finish them even if you start to feel better.   °· Do not take aspirin.   °· Get lots of rest.   °· Gargle with 8 oz of salt water (½ tsp of salt per 1 qt of water) as often as every 1 2 hours to soothe your throat.   °· Throat lozenges (if you are not at risk for choking) or sprays may be used to soothe your throat. °SEEK MEDICAL  CARE IF:  °· You have large, tender lumps in your neck. °· You have a rash. °· You cough up green, yellow-brown, or bloody spit. °SEEK IMMEDIATE MEDICAL CARE IF:  °· Your neck becomes stiff. °· You drool or are unable to swallow liquids. °· You vomit or are unable to keep medicines or liquids down. °· You have severe pain that does not go away with the use of recommended medicines. °· You have trouble breathing (not caused by a stuffy nose). °MAKE SURE YOU:  °· Understand these instructions. °· Will watch your condition. °· Will get help right away if you are not doing well or get worse. °Document Released: 07/05/2005 Document Revised: 04/25/2013 Document Reviewed: 03/12/2013 °[employer]® Patient Information ©2014 [employer]. ° °

## 2013-12-13 NOTE — ED Notes (Signed)
C/o sore throat States she has had a fever and experiencing nausea

## 2013-12-13 NOTE — ED Provider Notes (Signed)
Chief Complaint   Chief Complaint  Patient presents with  . Sore Throat    History of Present Illness   Kelly Haley is a 33 year old female who has had a two-day history of severe sore throat and pain on swallowing. She is able to swallow her saliva, and she has no difficulty speaking. She's had a temperature of up to 101, chills, headache, left ear pain, swollen gland on the left, and nausea. She denies any nasal congestion, rhinorrhea, cough, vomiting, or abdominal pain. There is no known history of exposure to strep. No prior history of strep pharyngitis.   Review of Systems   Other than as noted above, the patient denies any of the following symptoms. Systemic:  No fever, chills, sweats, myalgias, or headache. Eye:  No redness, pain or drainage. ENT:  No earache, nasal congestion, sneezing, rhinorrhea, sinus pressure, sinus pain, or post nasal drip. Lungs:  No cough, sputum production, wheezing, shortness of breath, or chest pain. GI:  No abdominal pain, nausea, vomiting, or diarrhea. Skin:  No rash.  PMFSH   Past medical history, family history, social history, meds, and allergies were reviewed. She is allergic to penicillin.  Physical Exam     Vital signs:  BP 125/85  Pulse 94  Temp(Src) 98.2 F (36.8 C) (Oral)  Resp 16  SpO2 100%  LMP 12/04/2013 General:  Alert, in no distress. Phonation was normal, no drooling, and patient was able to handle secretions well.  Eye:  No conjunctival injection or drainage. Lids were normal. ENT:  TMs and canals were normal, without erythema or inflammation.  Nasal mucosa was clear and uncongested, without drainage.  Mucous membranes were moist.  Exam of pharynx was unremarkable with no erythema, no exudate, no swelling.  There were no oral ulcerations or lesions. There was no bulging of the tonsillar pillars, and the uvula was midline. Neck:  Supple, no adenopathy, tenderness or mass. Lungs:  No respiratory distress.  Lungs were  clear to auscultation, without wheezes, rales or rhonchi.  Breath sounds were clear and equal bilaterally.  Heart:  Regular rhythm, without gallops, murmers or rubs. Skin:  Clear, warm, and dry, without rash or lesions.  Labs   Results for orders placed during the hospital encounter of 12/13/13  POCT RAPID STREP A ([facility] CARE ONLY)      Result Value Ref Range   Streptococcus, Group A Screen (Direct) NEGATIVE  NEGATIVE    Assessment   The encounter diagnosis was Pharyngitis.  There is no evidence of a peritonsillar abscess, retropharyngeal abscess, or epiglottitis.    Plan     1.  Meds:  The following meds were prescribed:   Discharge Medication List as of 12/13/2013  5:55 PM    START taking these medications   Details  azithromycin (ZITHROMAX) 500 MG tablet Take 1 tablet (500 mg total) by mouth daily., Starting 12/13/2013, Until Discontinued, Normal    HYDROcodone-acetaminophen (NORCO/VICODIN) 5-325 MG per tablet 1 to 2 tabs every 4 to 6 hours as needed for pain., Print    predniSONE (DELTASONE) 20 MG tablet Take 1 tablet (20 mg total) by mouth 2 (two) times daily., Starting 12/13/2013, Until Discontinued, Normal        2.  Patient Education/Counseling:  The patient was given appropriate handouts, self care instructions, and instructed in symptomatic relief, including hot saline gargles, throat lozenges, infectious precautions, and need to trade out toothbrush.    3.  Follow up:  The patient was told to  follow up here if no better in 3 to 4 days, or sooner if becoming worse in any way, and given some red flag symptoms such as difficulty swallowing or breathing which would prompt immediate return.      Reuben Likes, MD 12/13/13 [phone]

## 2013-12-15 LAB — CULTURE, GROUP A STREP

## 2014-02-27 ENCOUNTER — Encounter (HOSPITAL_COMMUNITY): Payer: Self-pay | Admitting: Emergency Medicine

## 2014-02-27 ENCOUNTER — Emergency Department (HOSPITAL_COMMUNITY)
Admission: EM | Admit: 2014-02-27 | Discharge: 2014-02-27 | Disposition: A | Discharge status: Home / Self Care | Payer: Self-pay | Attending: Emergency Medicine | Admitting: Emergency Medicine

## 2014-02-27 DIAGNOSIS — R109 Unspecified abdominal pain: Secondary | ICD-10-CM | POA: Insufficient documentation

## 2014-02-27 DIAGNOSIS — Z880 Allergy status to penicillin: Secondary | ICD-10-CM | POA: Insufficient documentation

## 2014-02-27 DIAGNOSIS — Z3202 Encounter for pregnancy test, result negative: Secondary | ICD-10-CM | POA: Insufficient documentation

## 2014-02-27 DIAGNOSIS — R110 Nausea: Secondary | ICD-10-CM | POA: Insufficient documentation

## 2014-02-27 DIAGNOSIS — F17200 Nicotine dependence, unspecified, uncomplicated: Secondary | ICD-10-CM | POA: Insufficient documentation

## 2014-02-27 DIAGNOSIS — Z789 Other specified health status: Secondary | ICD-10-CM | POA: Insufficient documentation

## 2014-02-27 LAB — URINALYSIS, ROUTINE W REFLEX MICROSCOPIC
BILIRUBIN URINE: NEGATIVE
Glucose, UA: NEGATIVE mg/dL
HGB URINE DIPSTICK: NEGATIVE
KETONES UR: NEGATIVE mg/dL
Leukocytes, UA: NEGATIVE
NITRITE: NEGATIVE
Protein, ur: NEGATIVE mg/dL
Specific Gravity, Urine: 1.016 (ref 1.005–1.030)
UROBILINOGEN UA: 1 mg/dL (ref 0.0–1.0)
pH: 7 (ref 5.0–8.0)

## 2014-02-27 LAB — POC URINE PREG, ED: PREG TEST UR: NEGATIVE

## 2014-02-27 MED ORDER — IBUPROFEN 800 MG PO TABS
800.0000 mg | ORAL_TABLET | Freq: Once | ORAL | Status: AC
  Administered 2014-02-27: 800 mg via ORAL
  Filled 2014-02-27: qty 1

## 2014-02-27 MED ORDER — OXYCODONE-ACETAMINOPHEN 5-325 MG PO TABS
1.0000 | ORAL_TABLET | Freq: Once | ORAL | Status: DC
  Filled 2014-02-27: qty 1

## 2014-02-27 MED ORDER — ONDANSETRON 8 MG PO TBDP
8.0000 mg | ORAL_TABLET | Freq: Once | ORAL | Status: AC
  Administered 2014-02-27: 8 mg via ORAL
  Filled 2014-02-27: qty 1

## 2014-02-27 MED ORDER — OXYCODONE-ACETAMINOPHEN 5-325 MG PO TABS: 1.0000 | ORAL_TABLET | Freq: Three times a day (TID) | ORAL | Status: DC | PRN

## 2014-02-27 NOTE — Discharge Instructions (Signed)

## 2014-02-27 NOTE — ED Provider Notes (Signed)
CSN: [id number]     Arrival date & time 02/27/14  1127 History   First MD Initiated Contact with Patient 02/27/14 1135     Chief Complaint  Patient presents with  . Flank Pain      Patient is a 33 y.o. female presenting with flank pain. The history is provided by the patient.  Flank Pain This is a new problem. The current episode started more than 2 days ago. The problem occurs daily. The problem has been gradually worsening. Pertinent negatives include no chest pain, no abdominal pain and no shortness of breath. Exacerbated by: movement,palpation. The symptoms are relieved by rest.  Pt reports increasing left flank pain for past week She reports mild urinary frequency but no dysuria No fever/vomiting No cp/sob No vag bleeding/discharge is reported    Past Medical History  Diagnosis Date  . Medical history non-contributory    Past Surgical History  Procedure Laterality Date  . Tubal ligation    . Carpal tunnel release     No family history on file. History  Substance Use Topics  . Smoking status: Current Some Day Smoker  . Smokeless tobacco: Never Used  . Alcohol Use: No   OB History   Grav Para Term Preterm Abortions TAB SAB Ect Mult Living   4 3 3  1 1    3      Review of Systems  Constitutional: Negative for fever.  Respiratory: Negative for shortness of breath.   Cardiovascular: Negative for chest pain.  Gastrointestinal: Positive for nausea. Negative for vomiting, abdominal pain and diarrhea.  Genitourinary: Positive for flank pain. Negative for dysuria, vaginal bleeding and vaginal discharge.  All other systems reviewed and are negative.     Allergies  Penicillins  Home Medications   Prior to Admission medications   Medication Sig Start Date End Date Taking? Authorizing Provider  acetaminophen (TYLENOL) 500 MG tablet Take 1,000 mg by mouth every 6 (six) hours as needed for pain.   Yes Historical Provider, MD   BP 129/64  Pulse 72  Temp(Src) 98.4 F  (36.9 C) (Oral)  Resp 16  SpO2 98%  LMP 02/22/2014 Physical Exam CONSTITUTIONAL: Well developed/well nourished HEAD: Normocephalic/atraumatic EYES: EOMI/PERRL ENMT: Mucous membranes moist NECK: supple no meningeal signs SPINE:entire spine nontender CV: S1/S2 noted, no murmurs/rubs/gallops noted LUNGS: Lungs are clear to auscultation bilaterally, no apparent distress ABDOMEN: soft, nontender, no rebound or guarding WU:JWJXGU:left cva tenderness NEURO: Pt is awake/alert, moves all extremitiesx4 Full ROM of both lower extremities without any weakness identified EXTREMITIES: pulses normal, full ROM SKIN: warm, color normal PSYCH: no abnormalities of mood noted  ED Course  Procedures Labs Review Labs Reviewed  URINALYSIS, ROUTINE W REFLEX MICROSCOPIC  POC URINE PREG, ED    Pt well appearing, no distress, ambulatory without difficulty Suspect possible muscle strain given location We discussed strict return precautions I doubt acute abdominal/urologic/neurologic process.  I doubt PE/ACS (no CP/SOB reported) MDM   Final diagnoses:  Flank pain    Nursing notes including past medical history and social history reviewed and considered in documentation Labs/vital reviewed and considered     Joya Gaskinsonald W Nairi Oswald, MD 02/27/14 1246

## 2014-02-27 NOTE — ED Notes (Signed)
Per pt, states left flank pain and urinary frequency on and off for days

## 2014-02-27 NOTE — Progress Notes (Signed)
[facility] provided pt with a list of primary care resources and a GCCN Orange Card application to help patient establish primary care.  °

## 2014-04-10 ENCOUNTER — Inpatient Hospital Stay (HOSPITAL_COMMUNITY)
Admission: AD | Admit: 2014-04-10 | Discharge: 2014-04-10 | Disposition: A | Discharge status: Home / Self Care | Payer: Self-pay | Source: Ambulatory Visit | Attending: Obstetrics & Gynecology | Admitting: Obstetrics & Gynecology

## 2014-04-10 ENCOUNTER — Encounter (HOSPITAL_COMMUNITY): Payer: Self-pay | Admitting: *Deleted

## 2014-04-10 DIAGNOSIS — IMO0002 Reserved for concepts with insufficient information to code with codable children: Secondary | ICD-10-CM | POA: Insufficient documentation

## 2014-04-10 DIAGNOSIS — F17200 Nicotine dependence, unspecified, uncomplicated: Secondary | ICD-10-CM | POA: Insufficient documentation

## 2014-04-10 DIAGNOSIS — R109 Unspecified abdominal pain: Secondary | ICD-10-CM | POA: Insufficient documentation

## 2014-04-10 DIAGNOSIS — S335XXA Sprain of ligaments of lumbar spine, initial encounter: Secondary | ICD-10-CM

## 2014-04-10 DIAGNOSIS — S29012S Strain of muscle and tendon of back wall of thorax, sequela: Secondary | ICD-10-CM

## 2014-04-10 DIAGNOSIS — X58XXXA Exposure to other specified factors, initial encounter: Secondary | ICD-10-CM

## 2014-04-10 LAB — CBC WITH DIFFERENTIAL/PLATELET
Basophils Absolute: 0 10*3/uL (ref 0.0–0.1)
Basophils Relative: 0 % (ref 0–1)
EOS ABS: 0.1 10*3/uL (ref 0.0–0.7)
EOS PCT: 2 % (ref 0–5)
HEMATOCRIT: 41.6 % (ref 36.0–46.0)
Hemoglobin: 13.7 g/dL (ref 12.0–15.0)
Lymphocytes Relative: 34 % (ref 12–46)
Lymphs Abs: 2.5 10*3/uL (ref 0.7–4.0)
MCH: 28 pg (ref 26.0–34.0)
MCHC: 32.9 g/dL (ref 30.0–36.0)
MCV: 85.1 fL (ref 78.0–100.0)
Monocytes Absolute: 0.4 10*3/uL (ref 0.1–1.0)
Monocytes Relative: 5 % (ref 3–12)
Neutro Abs: 4.5 10*3/uL (ref 1.7–7.7)
Neutrophils Relative %: 59 % (ref 43–77)
PLATELETS: 299 10*3/uL (ref 150–400)
RBC: 4.89 MIL/uL (ref 3.87–5.11)
RDW: 13 % (ref 11.5–15.5)
WBC: 7.6 10*3/uL (ref 4.0–10.5)

## 2014-04-10 LAB — URINALYSIS, ROUTINE W REFLEX MICROSCOPIC
BILIRUBIN URINE: NEGATIVE
GLUCOSE, UA: NEGATIVE mg/dL
HGB URINE DIPSTICK: NEGATIVE
Ketones, ur: NEGATIVE mg/dL
Leukocytes, UA: NEGATIVE
Nitrite: NEGATIVE
PROTEIN: NEGATIVE mg/dL
Specific Gravity, Urine: 1.02 (ref 1.005–1.030)
UROBILINOGEN UA: 1 mg/dL (ref 0.0–1.0)
pH: 6.5 (ref 5.0–8.0)

## 2014-04-10 LAB — COMPREHENSIVE METABOLIC PANEL
ALT: 13 U/L (ref 0–35)
AST: 15 U/L (ref 0–37)
Albumin: 3.6 g/dL (ref 3.5–5.2)
Alkaline Phosphatase: 67 U/L (ref 39–117)
Anion gap: 13 (ref 5–15)
BUN: 20 mg/dL (ref 6–23)
CALCIUM: 9.2 mg/dL (ref 8.4–10.5)
CO2: 24 mEq/L (ref 19–32)
Chloride: 104 mEq/L (ref 96–112)
Creatinine, Ser: 0.62 mg/dL (ref 0.50–1.10)
GFR calc non Af Amer: >90 mL/min (ref >90)
GLUCOSE: 90 mg/dL (ref 70–99)
Potassium: 3.9 mEq/L (ref 3.7–5.3)
SODIUM: 141 meq/L (ref 137–147)
Total Bilirubin: 0.4 mg/dL (ref 0.3–1.2)
Total Protein: 6.9 g/dL (ref 6.0–8.3)

## 2014-04-10 LAB — POCT PREGNANCY, URINE: Preg Test, Ur: NEGATIVE

## 2014-04-10 MED ORDER — IBUPROFEN 600 MG PO TABS: 600.0000 mg | ORAL_TABLET | Freq: Four times a day (QID) | ORAL | Status: DC | PRN

## 2014-04-10 MED ORDER — KETOROLAC TROMETHAMINE 60 MG/2ML IM SOLN
60.0000 mg | Freq: Once | INTRAMUSCULAR | Status: AC
  Administered 2014-04-10: 60 mg via INTRAMUSCULAR
  Filled 2014-04-10: qty 2

## 2014-04-10 MED ORDER — CYCLOBENZAPRINE HCL 10 MG PO TABS: 10.0000 mg | ORAL_TABLET | Freq: Two times a day (BID) | ORAL | Status: DC | PRN

## 2014-04-10 NOTE — MAU Provider Note (Signed)
History     CSN: [id number]  Arrival date and time: 04/10/14 4098   First Provider Initiated Contact with Patient 04/10/14 [phone]      Chief Complaint  Patient presents with  . Back Pain   HPI [name] is a 33 y.o. [phone] who presents to MAU today with complaint of left flank pain x 3 weeks. She was seen at [facility] with onset of pain and diagnosed with muscle strain. She denies dysuria today. She states that she thought she noted hematuria this weekend. She has had nausea, vomiting and diarrhea over the weekend. She denies fever or constipation.    OB History   Grav Para Term Preterm Abortions TAB SAB Ect Mult Living   Past Medical History  Diagnosis Date  . Medical history non-contributory     Past Surgical History  Procedure Laterality Date  . Tubal ligation    . Carpal tunnel release    . Carpal tunnel release      History reviewed. No pertinent family history.  History  Substance Use Topics  . Smoking status: Current Some Day Smoker  . Smokeless tobacco: Never Used  . Alcohol Use: No    Allergies:  Allergies  Allergen Reactions  . Penicillins Hives and Nausea And Vomiting    Prescriptions prior to admission  Medication Sig Dispense Refill  . acetaminophen (TYLENOL) 500 MG tablet Take 1,000 mg by mouth every 6 (six) hours as needed for pain.      Marland Kitchen oxyCODONE-acetaminophen (PERCOCET/ROXICET) 5-325 MG per tablet Take 1 tablet by mouth every 8 (eight) hours as needed for severe pain.  10 tablet  0    Review of Systems  Constitutional: Negative for fever and malaise/fatigue.  Gastrointestinal: Positive for nausea, vomiting, abdominal pain and diarrhea. Negative for constipation.  Genitourinary: Positive for hematuria and flank pain. Negative for dysuria, urgency and frequency.       + vaginal bleeding Neg - vaginal discharge   Physical Exam   Blood pressure 129/75, pulse 88, temperature 97.7 F (36.5 C), temperature  source Oral, resp. rate 18, last menstrual period 03/27/2014.  Physical Exam  Constitutional: She is oriented to person, place, and time. She appears well-developed and well-nourished. No distress.  HENT:  Head: Normocephalic.  Cardiovascular: Normal rate.   Respiratory: Effort normal.  GI: Soft. She exhibits no distension and no mass. There is no tenderness. There is no rebound, no guarding and no CVA tenderness.  Musculoskeletal:       Arms: Neurological: She is alert and oriented to person, place, and time.  Skin: Skin is warm and dry. No erythema.  Psychiatric: She has a normal mood and affect.    Results for orders placed during the hospital encounter of 04/10/14 (from the past 24 hour(s))  URINALYSIS, ROUTINE W REFLEX MICROSCOPIC     Status: None   Collection Time    04/10/14  5:40 AM      Result Value Ref Range   Color, Urine YELLOW  YELLOW   APPearance CLEAR  CLEAR   Specific Gravity, Urine 1.020  1.005 - 1.030   pH 6.5  5.0 - 8.0   Glucose, UA NEGATIVE  NEGATIVE mg/dL   Hgb urine dipstick NEGATIVE  NEGATIVE   Bilirubin Urine NEGATIVE  NEGATIVE   Ketones, ur NEGATIVE  NEGATIVE mg/dL   Protein, ur NEGATIVE  NEGATIVE mg/dL   Urobilinogen, UA 1.0  0.0 - 1.0 mg/dL   Nitrite NEGATIVE  NEGATIVE   Leukocytes, UA NEGATIVE  NEGATIVE  CBC WITH DIFFERENTIAL     Status: None   Collection Time    04/10/14  6:52 AM      Result Value Ref Range   WBC 7.6  4.0 - 10.5 K/uL   RBC 4.89  3.87 - 5.11 MIL/uL   Hemoglobin 13.7  12.0 - 15.0 g/dL   HCT 16.1  09.6 - 04.5 %   MCV 85.1  78.0 - 100.0 fL   MCH 28.0  26.0 - 34.0 pg   MCHC 32.9  30.0 - 36.0 g/dL   RDW 40.9  81.1 - 91.4 %   Platelets 299  150 - 400 K/uL   Neutrophils Relative % 59  43 - 77 %   Neutro Abs 4.5  1.7 - 7.7 K/uL   Lymphocytes Relative 34  12 - 46 %   Lymphs Abs 2.5  0.7 - 4.0 K/uL   Monocytes Relative 5  3 - 12 %   Monocytes Absolute 0.4  0.1 - 1.0 K/uL   Eosinophils Relative 2  0 - 5 %   Eosinophils Absolute  0.1  0.0 - 0.7 K/uL   Basophils Relative 0  0 - 1 %   Basophils Absolute 0.0  0.0 - 0.1 K/uL  COMPREHENSIVE METABOLIC PANEL     Status: None   Collection Time    04/10/14  6:52 AM      Result Value Ref Range   Sodium 141  137 - 147 mEq/L   Potassium 3.9  3.7 - 5.3 mEq/L   Chloride 104  96 - 112 mEq/L   CO2 24  19 - 32 mEq/L   Glucose, Bld 90  70 - 99 mg/dL   BUN 20  6 - 23 mg/dL   Creatinine, Ser 7.82  0.50 - 1.10 mg/dL   Calcium 9.2  8.4 - 95.6 mg/dL   Total Protein 6.9  6.0 - 8.3 g/dL   Albumin 3.6  3.5 - 5.2 g/dL   AST 15  0 - 37 U/L   ALT 13  0 - 35 U/L   Alkaline Phosphatase 67  39 - 117 U/L   Total Bilirubin 0.4  0.3 - 1.2 mg/dL   GFR calc non Af Amer >90  >90 mL/min   GFR calc Af Amer >90  >90 mL/min   Anion gap 13  5 - 15    MAU Course  Procedures None  MDM UPT - negative UA, CBC, CMP today  Assessment and Plan  A: Muscle strain, back  P: Discharge home Rx for Ibuprofen and Flexeril sent to patient's pharmacy Patient advised to rest and use ice and heat to the affected area Patient given list of resources for PCP. May need PT or Ortho referral if symptoms persist Patient may return to MAU as needed or if her condition were to change or worsen   Marny Lowenstein, PA-C  04/10/2014, 7:31 AM

## 2014-04-10 NOTE — Discharge Instructions (Signed)
Muscle Strain A muscle strain (pulled muscle) happens when a muscle is stretched beyond normal length. It happens when a sudden, violent force stretches your muscle too far. Usually, a few of the fibers in your muscle are torn. Muscle strain is common in athletes. Recovery usually takes 1-2 weeks. Complete healing takes 5-6 weeks.  HOME CARE   Follow the PRICE method of treatment to help your injury get better. Do this the first 2-3 days after the injury:  Protect. Protect the muscle to keep it from getting injured again.  Rest. Limit your activity and rest the injured body part.  Ice. Put ice in a plastic bag. Place a towel between your skin and the bag. Then, apply the ice and leave it on from 15-20 minutes each hour. After the third day, switch to moist heat packs.  Compression. Use a splint or elastic bandage on the injured area for comfort. Do not put it on too tightly.  Elevate. Keep the injured body part above the level of your heart.  Only take medicine as told by your doctor.  Warm up before doing exercise to prevent future muscle strains. GET HELP IF:   You have more pain or puffiness (swelling) in the injured area.  You feel numbness, tingling, or notice a loss of strength in the injured area. MAKE SURE YOU:   Understand these instructions.  Will watch your condition.  Will get help right away if you are not doing well or get worse. Document Released: 04/13/2008 Document Revised: 04/25/2013 Document Reviewed: 02/01/2013 [facility] Patient Information 2015 [place], [place]. This information is not intended to replace advice given to you by your health care provider. Make sure you discuss any questions you have with your health care provider.   [facility] Guide (Revised August 2014)   Chronic Pain Problems:    [facility]:  [phone]           Patients need to be referred by their primary care  doctor/specialist  Insufficient Money for Medicine:           United Way: call "211"     MAP Program at [facility] Department - [place] 5515073614 or [place] [phone]            No Primary Care Doctor:  To locate a primary care doctor that accepts your insurance or provides certain services:           [place] Connect: [phone]           Physician Referral Service: [phone] ask for My [place]   If no insurance, you need to see if you qualify for [facility] orange card, call to set      up appointment for eligibility/enrollment at [phone] or [phone] or visit [facility] of Health and [employer] ([address], [place] and [address] -[place]) to meet with a [facility] enrollment specialist.  Agencies that provide inexpensive (sliding fee scale) medical care:        [facility] - Family Medicine at [place] - [phone]      [facility]  -  [facility] [phone]      [facility] Medicine - [phone]      [facility] - [phone]      [facility] - [phone]      [facility] Family Practice [phone]   [facility] - [facility] Child Health @ [place] [phone]   [facility] - [facility] @ [place] - [phone]   [facility] Family Practice: [phone]    Women's Clinic: [phone]    Planned Parenthood: [phone]    [facility] of the [place]    Medicaid-accepting [facility] Providers:           [facility] [phone] (No Family Planning accepted)          [address], [phone], Mon-Fri 9am-5pm          [facility] - [phone]   [address] [place], [address] [place], [place] 8am-5pm, Fri 8am-noon   [employer] - [phone]          [address], [address], Mon-Fri 7:30am-4:30pm          [facility] - [phone]          [address] [place] 8am-5pm          [facility] - [phone] [address]          Only accepts [place] [employer] patients after they have their name applied to their card  Self Pay (no insurance) in [facility]:           Sickle Cell Patients:    [address] [place], [phone] [facility] Internal Medicine:   [address] [place] [phone]       [facility] and Wellness   [address], [place] [phone]  [facility] Family Practice:   [address] [phone]          [facility]gent Care           [address] [place], [phone] [facility]   [address] [place], [phone]           [facility]gent Care [place]           [address], [place] [phone]        [facility] - [address]           [phone], Mon-Fri 9am-7pm, [place] 9am-1pm          [facility] - Family Medicine @ [facility]          [address] [employer] [place], [phone]          [facility] - [facility]           [address] [phone] [facility] - [facility]   [address], [place] [phone]          [facility]           [address] [phone]  [facility] - [facility] Health    [address] [place], [phone] [facility] - [facility]   [address] [phone]  Dr. Julio Sicks           [address], [place], [phone]  [facility] Care           [address], [phone]          [facility]             [address] [phone]          [facility]            [address] [place], [phone], 1st & 3rd Saturday every month, 10am-1pm  OTHERS:  [employer]  [employer] Only)  [phone] (Thursday only)  Strategies for finding a Primary Care Provider:  1) Find a Doctor and Pay Out of Pocket  Although you won't have to find out who is covered by your insurance plan, it is a good idea to ask around and get recommendations. You will then need to call the office and see if the doctor you have chosen will accept you as a new patient and what types of options they offer for patients who are self-pay. Some doctors offer discounts or will set up payment plans for their patients who do not have insurance, but you will need to ask so you aren't surprised when you get to your appointment.  2) Contact [place] [employer] - To see if you qualify for orange card access to healthcare safety net providers.  Call for appointment for eligibility/enrollment at [phone] or [phone]. (Uninsured, 0-200% FPL, qualifying info)  Applicants for [facility] are first required to see if they are eligible to enroll in the [facility] Marketplace before enrolling in [facility] (and get an exemption if they are not).  GCCN Criteria for acceptance is:    Proof of Engineering geologist exemption - form or documentation    Valid photo ID (driver's license, state identification card, passport, home country ID)    Proof of [facility] residency (e.g. drivers license, lease/landlord information, pay stubs with address, utility bill, bank statement, etc.)    Proof of income (1040, last year's tax return, W2, 4 current pay stubs, other income proof)    Proof of assets (current bank statement + 3 most recent, disability paperwork, life insurance info, tax value on autos, etc.)  3) Contact Your Local Health Department  Not all health departments have doctors that can see patients for sick visits, but many do, so it is worth a call to see if yours does. If you don't know where your  local health department is, you can check in your phone book. The CDC also has a tool to help you locate your state's health department, and many state websites also have listings of all of their local health departments.  4) Find a Walk-in Clinic  If your illness is not likely to be very severe or complicated, you may want to try a walk in clinic. These are popping up all over the country in pharmacies, drugstores, and shopping centers. They're usually staffed by nurse practitioners or physician assistants that have been trained to treat common illnesses and complaints. They're usually fairly quick and inexpensive. However, if you have serious medical issues or chronic medical problems, these are probably not your best option   STD Testing:           [facility] of [facility] [place], [place] [facility]           [address], [place], phone [phone] or [phone]           Monday - Friday, call for an appointment  [facility] Department of Public Health [place], [place] [facility]           [address], [place], phone [phone] or [phone]           Monday - Friday, call for an appointment Abuse/Neglect:           [facility] Child Abuse Hotline: [phone]           [facility] Child Abuse Hotline: (781)387-6599 (After Hours)  Emergency Shelter:  [facility] 4808825865  [facility] [place]- [phone]  [facility] [place] - 5485195405  Youth Focus - Act Together - [phone] (ages 81-17)  Homeless Day Shelter @ [employer] - [phone]   Mammograms - Free at [facility] [phone]  Maternity Homes:           Room at the [facility]: [phone]   (Homeless mother with children)          [facility] Alert Services: [phone] (Mothers only)   Youth Focus: [phone] (Pregnant 67-52 years old)   Adopt a Mom -([phone]  [facility]     [facility] - Lanae Boast   [address] [place] [phone]          [facility] [place]           [address] [place]           [phone]          [facility]           [address], [place]           [phone]          [facility]           [address], [place]           [phone]          [facility] Mental Health           [phone]          [facility]           [phone]          [facility] in [facility]           [address]           [phone], [facility] Child Abuse Hotline           8155680306           [phone] (After Hours)  Behavioral Health Services /Substance Abuse Resources:           Alcohol and Drug Services: [phone]           [facility]: [phone]          [facility]: [phone]    Narcotics Helpline - [phone]          [facility]: [phone]           Residential & Outpatient Substance Abuse Program - [facility] [place]: [phone]   [facility] and [facility]ness Center - [phone] Psychological Services:          [facility]: [phone]    Therapeutic Alternatives: [phone]  [facility] Mental Health           [address], [place]           ACCESS LINE: [phone]     (24 Hour)   Mobile Crisis:    HELPLINES:  Financial risk analyst on Mental Illness - [place] [phone] [facility] on Mental Illness - [place] [phone]   Walk In [facility] - [address] - [place]  [phone]       [facility] - [phone] or [phone]  [facility] - [phone] [address] - [employer] [phone]  [facility] [phone]. [address], [place] [phone]   Dental Assistance:  If unable to pay or uninsured, contact: [facility]. to become qualified for the adult dental clinic. Patient must be enrolled in [facility] (uninsured, 0-200% FPL, qualifying info).  Enroll in [facility] first, then see Primary Care Physician assigned to you, the PCP makes a dental referral. [place] [facility] will receive referral and contacts patient for appointment.  Patients with Medicaid           [address], [phone]  [facility] (Children up to 20 + Pregnant Women) - [phone]  [facility] - [address] - [address] [phone]  If unable to pay, or uninsured: contact [facility] Department [phone] in [place] - (Children only + Pregnant Women), [phone] in [facility] Children only) to become qualified for the adult dental clinic  Must see if eligible to enroll in [facility] Marketplace before enrolling into the [facility] (exemption required) [phone] for an appointment)  [email or web address];   [phone].  If not eligible for ACA, then go by Department of Health and Human Services to see if eligible for orange card.  [address] [place] and [phone]  Once you get an orange card, you will have a Primary Care home who will then refer you to dental if needed.        Other IT consultant:   [facility] [phone] (ext [phone])   [address]  Dr. Lawrence Marseilles - [phone]   [address]    [place] - [phone]   [facility]           [address] [place], [place], [place], [address]           [phone], Ext. [phone]           2nd and 4th Thursday of the month at 6:30am (Simple extractions only - no wisdom teeth or surgery) First come/First serve -First 10 clients served           [facility] [place], [place] and [place] residents only)          [address] [place], [place], [address]           [phone]                    [facility] Health Department           [phone]          [facility] Health Department          [phone]  [facility] Department - [facility]          [phone]   Transportation Options:  Ambulance - 911 - [phone] per ride Family Member to accompany patient (if stable) [employer] - [phone]  PART - [phone]  Taxi - [phone] - [place]  SCAT - [phone] (Application required)  [facility] - [phone]

## 2014-04-10 NOTE — MAU Note (Signed)
PT SAYS   1 MTH AGO SHE WAS AT [facility]  FOR   LEFT SIDE AND  LEFT BACK PAIN.Marland Kitchen  THOUGHT KIDNEY STONE- BUT UA - CLEAR.  HAS CONTINUED TO HURT AND YESTERDAY AT WORK- PAIN WORSE.  SAW BLOOD IN TOILET AND WHEN WIPED  ON SAT.   HAD BTL -2005.

## 2014-04-10 NOTE — MAU Note (Signed)
Pt states that she has had back pain on left side radiating to the front of left side occurring for a couple of weeks. Pt had been seen at [facility] a couple of weeks ago but was told she did not have UTI or kidney stones. Pt also saw blood in urine on Sat.

## 2014-05-20 ENCOUNTER — Encounter (HOSPITAL_COMMUNITY): Payer: Self-pay | Admitting: *Deleted

## 2014-07-02 DIAGNOSIS — H9319 Tinnitus, unspecified ear: Secondary | ICD-10-CM | POA: Insufficient documentation

## 2014-11-08 NOTE — Op Note (Signed)
PATIENT NAME:  Kelly Haley, Kelly Haley MR#:  [id number] DATE OF BIRTH:  [birth year]  DATE OF PROCEDURE:  08/17/2012  PREOPERATIVE DIAGNOSIS: Left carpal tunnel syndrome.   POSTOPERATIVE DIAGNOSIS: Left carpal tunnel syndrome.   PROCEDURE: Left carpal tunnel release.   SURGEON: Leitha SchullerMichael J. Twinkle Sockwell, MD  ANESTHESIA: General.  DESCRIPTION OF PROCEDURE: The patient was brought to the operating room and after adequate anesthesia was obtained the left arm was prepped and draped in the usual sterile fashion with tourniquet applied to the upper arm. After patient identification and timeout procedures were carried out, the tourniquet was raised to 250 mmHg and 10 mL of 0.5% Sensorcaine without epinephrine was infiltrated in the subcutaneous tissue for postop analgesia. Incision was made in line with the ring metacarpal, approximately 2.5 cm in length, skin and subcutaneous tissue were spread, and the transverse carpal ligament identified. Hemostasis was achieved with electrocautery. The transverse carpal ligament was incised and a small hemostat placed underneath this to protect the underlying structures. Release was carried out distally until fat underneath the ligament was identified consistent with a lack of compression. Going proximally, in the midportion of the carpal tunnel, there was approximately a 1 cm area that appeared to have been compressed chronically with some hourglass changes to the nerve. After proximal release to the level of the wrist flexion crease, there was good vascular blush to the nerve. Exploration of the carpal tunnel revealed no masses and mild tenosynovitis. The wound was then irrigated and closed with simple interrupted 4-0 nylon skin sutures. Xeroform, 4 x 4's, Webril and Ace wrap were applied and the tourniquet let down.   TOURNIQUET TIME: 10 minutes at 250 mmHg.        COMPLICATIONS: None.   SPECIMENS: None.  ____________________________ Leitha SchullerMichael J. Demarcus Thielke,  MD mjm:sb Haley: 08/17/2012 22:04:45 ET T: 08/18/2012 10:37:34 ET JOB#: [id number]  cc: Leitha SchullerMichael J. Channon Ambrosini, MD, <Dictator> Leitha SchullerMICHAEL J Hernan Turnage MD ELECTRONICALLY SIGNED 08/18/2012 15:07

## 2014-11-08 NOTE — Op Note (Signed)
PATIENT NAME:  Kelly Haley, Kelly Haley MR#:  [id number] DATE OF BIRTH:  09-12-1980  DATE OF PROCEDURE:  12/07/2012  PREOPERATIVE DIAGNOSIS: Right carpal tunnel syndrome.   POSTOPERATIVE DIAGNOSIS: Right carpal tunnel syndrome.   PROCEDURE: Carpal tunnel release.   ANESTHESIA:  General.   SURGEON: Leitha SchullerMichael J. Finian Helvey, MD   DESCRIPTION OF PROCEDURE: The patient was brought to the operating room and after adequate anesthesia was obtained, the right arm was prepped and draped in the usual sterile fashion with a tourniquet applied to the upper arm. After appropriate patient identification, timeout procedures were completed and prepping and draping, tourniquet was raised to 250 mmHg and 10 mL of 0.5% Sensorcaine was infiltrated. Incision was made in line with the ring metacarpal, approximately 2.5 cm in length. After incising through the skin and subcutaneous tissue the transverse carpal ligament was identified and a small incision made distally. A small vascular hemostat was placed underneath the transverse carpal ligament to protect the median nerve. Proximally, release was carried out to the level of the wrist flexion crease. It was at the level of the wrist flexion crease that there was approximately 3 to 4 mm area that appeared to be stenotic. After release of this, there was good vascular blush to the nerve and compression appeared to be resolved. There are no masses within the carpal tunnel, just mild flexor tenosynovitis. Next, the wound was irrigated and the wound closed with simple interrupted 5-0 nylon. Xeroform, 4 x 4's, Webril, Ace wrap were applied and the tourniquet let down.   TOURNIQUET TIME: 8 minutes at 250 mmHg.   COMPLICATIONS: None.    SPECIMEN: No.   ESTIMATED BLOOD LOSS:  Minimal.     ____________________________ Leitha SchullerMichael J. Shaquanna Lycan, MD mjm:cc Haley: 12/07/2012 21:25:42 ET T: 12/07/2012 23:54:31 ET JOB#: [id number]  cc: Leitha SchullerMichael J. Fremon Zacharia, MD, <Dictator> Leitha SchullerMICHAEL J Aki Burdin MD ELECTRONICALLY  SIGNED 12/08/2012 8:20

## 2014-11-28 DIAGNOSIS — R2681 Unsteadiness on feet: Secondary | ICD-10-CM | POA: Insufficient documentation

## 2014-11-28 DIAGNOSIS — R2689 Other abnormalities of gait and mobility: Secondary | ICD-10-CM | POA: Insufficient documentation

## 2014-12-04 ENCOUNTER — Emergency Department
Admission: EM | Admit: 2014-12-04 | Discharge: 2014-12-05 | Disposition: A | Discharge status: Home / Self Care | Payer: Self-pay | Attending: Emergency Medicine | Admitting: Emergency Medicine

## 2014-12-04 ENCOUNTER — Encounter: Payer: Self-pay | Admitting: Emergency Medicine

## 2014-12-04 ENCOUNTER — Emergency Department: Payer: Self-pay

## 2014-12-04 DIAGNOSIS — Z79899 Other long term (current) drug therapy: Secondary | ICD-10-CM | POA: Insufficient documentation

## 2014-12-04 DIAGNOSIS — R55 Syncope and collapse: Secondary | ICD-10-CM | POA: Insufficient documentation

## 2014-12-04 DIAGNOSIS — Z880 Allergy status to penicillin: Secondary | ICD-10-CM | POA: Insufficient documentation

## 2014-12-04 DIAGNOSIS — Z87891 Personal history of nicotine dependence: Secondary | ICD-10-CM | POA: Insufficient documentation

## 2014-12-04 DIAGNOSIS — R519 Headache, unspecified: Secondary | ICD-10-CM

## 2014-12-04 DIAGNOSIS — R51 Headache: Secondary | ICD-10-CM

## 2014-12-04 MED ORDER — MORPHINE SULFATE 4 MG/ML IJ SOLN
INTRAMUSCULAR | Status: AC
  Administered 2014-12-04: 4 mg via INTRAVENOUS
  Filled 2014-12-04: qty 1

## 2014-12-04 MED ORDER — ONDANSETRON HCL 4 MG/2ML IJ SOLN
INTRAMUSCULAR | Status: AC
  Administered 2014-12-04: 4 mg via INTRAVENOUS
  Filled 2014-12-04: qty 2

## 2014-12-04 MED ORDER — MORPHINE SULFATE 4 MG/ML IJ SOLN
4.0000 mg | Freq: Once | INTRAMUSCULAR | Status: AC
  Administered 2014-12-04: 4 mg via INTRAVENOUS

## 2014-12-04 MED ORDER — FUROSEMIDE 40 MG PO TABS: 40.0000 mg | ORAL_TABLET | Freq: Once | ORAL | Status: DC

## 2014-12-04 MED ORDER — ONDANSETRON HCL 4 MG/2ML IJ SOLN
4.0000 mg | Freq: Once | INTRAMUSCULAR | Status: AC
  Administered 2014-12-04: 4 mg via INTRAVENOUS

## 2014-12-04 MED ORDER — ACETAZOLAMIDE 250 MG PO TABS: 500.0000 mg | ORAL_TABLET | Freq: Once | ORAL | Status: DC

## 2014-12-04 NOTE — ED Notes (Signed)
Pt states extensive hx of seeing MDs about current issue. Pt has seen ENTs and saw [facility] this week in reference to same complaint

## 2014-12-04 NOTE — ED Provider Notes (Signed)
[name] Emergency Department Provider Note  ____________________________________________  Time seen: 11:00PM    I have reviewed the triage vital signs and the nursing notes.   HISTORY  Chief Complaint Headache      HPI Kelly Haley is a 34 y.o. female presents with 10 out of 10 right sided headache headache 1 month. Patient admits to frequent episodes of the same since 2006. Patient denies any weakness or numbness. No change in vision no gait instability     Past Medical History  Diagnosis Date  . Medical history non-contributory     There are no active problems to display for this patient.   Past Surgical History  Procedure Laterality Date  . Tubal ligation    . Carpal tunnel release    . Carpal tunnel release      Current Outpatient Rx  Name  Route  Sig  Dispense  Refill  . acetaminophen (TYLENOL) 500 MG tablet   Oral   Take 1,000 mg by mouth every 6 (six) hours as needed for pain.         . cyclobenzaprine (FLEXERIL) 10 MG tablet   Oral   Take 1 tablet (10 mg total) by mouth 2 (two) times daily as needed for muscle spasms.   20 tablet   0   . ibuprofen (ADVIL,MOTRIN) 600 MG tablet   Oral   Take 1 tablet (600 mg total) by mouth every 6 (six) hours as needed.   30 tablet   0   . oxyCODONE-acetaminophen (PERCOCET/ROXICET) 5-325 MG per tablet   Oral   Take 1 tablet by mouth every 8 (eight) hours as needed for severe pain.   10 tablet   0     Allergies Penicillins  History reviewed. No pertinent family history.  Social History History  Substance Use Topics  . Smoking status: Former Games developermoker  . Smokeless tobacco: Never Used  . Alcohol Use: No    Review of Systems  Constitutional: Negative for fever. Eyes: Negative for visual changes. ENT: Negative for sore throat. Cardiovascular: Negative for chest pain. Respiratory: Negative for shortness of breath. Gastrointestinal: Negative for abdominal pain,  vomiting and diarrhea. Genitourinary: Negative for dysuria. Musculoskeletal: Negative for back pain. Skin: Negative for rash. Neurological: Positive for headaches, focal weakness or numbness.   10-point ROS otherwise negative.  ____________________________________________   PHYSICAL EXAM:  VITAL SIGNS: ED Triage Vitals  Enc Vitals Group     BP 12/04/14 1840 129/90 mmHg     Pulse Rate 12/04/14 1840 104     Resp 12/04/14 1840 18     Temp 12/04/14 1840 98.2 F (36.8 C)     Temp Source 12/04/14 1840 Oral     SpO2 12/04/14 1840 100 %     Weight 12/04/14 1840 230 lb (104.327 kg)     Height 12/04/14 1840 5\' 5"  (1.651 m)     Head Cir --      Peak Flow --      Pain Score 12/04/14 1842 7     Pain Loc --      Pain Edu? --      Excl. in GC? --      Constitutional: Alert and oriented. Apparent Distress Eyes: Conjunctivae are normal. PERRL. Normal extraocular movements. ENT   Head: Normocephalic and atraumatic.   Nose: No congestion/rhinnorhea.   Mouth/Throat: Mucous membranes are moist.   Neck: No stridor. Cardiovascular: Normal rate, regular rhythm. Normal and symmetric distal pulses are present in all  extremities. No murmurs, rubs, or gallops. Respiratory: Normal respiratory effort without tachypnea nor retractions. Breath sounds are clear and equal bilaterally. No wheezes/rales/rhonchi. Gastrointestinal: Soft and nontender. No distention. There is no CVA tenderness. Genitourinary: deferred Musculoskeletal: Nontender with normal range of motion in all extremities. No joint effusions.  No lower extremity tenderness nor edema. Neurologic:  Normal speech and language. No gross focal neurologic deficits are appreciated. Speech is normal.  Skin:  Skin is warm, dry and intact. No rash noted. Psychiatric: Mood and affect are normal. Speech and behavior are normal. Patient exhibits appropriate insight and judgment.  ____________________________________________   CT scan  of the head dictated as normal per radiologist    INITIAL IMPRESSION / ASSESSMENT AND PLAN / ED COURSE  Pertinent labs & imaging results that were available during my care of the patient were reviewed by me and considered in my medical decision making (see chart for details).  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 IV Morphine and Zofran given with pain resolution  ____________________________________________   FINAL CLINICAL IMPRESSION(S) / ED DIAGNOSES  Final diagnoses:  Headache, unspecified headache type      Darci Currentandolph N Kery Haltiwanger, MD 12/19/14 [phone]

## 2014-12-04 NOTE — ED Notes (Signed)
Pt in CT at this time.

## 2014-12-04 NOTE — ED Notes (Signed)
States she is having pressure to right side of head/face.symptoms's started about 1 mo ago  Has been seen by her pmd . Hx of same in 2006.  Had to have spinal tap d/t fluid  Feels the same

## 2014-12-04 NOTE — ED Notes (Signed)
Pt returned from CT °

## 2014-12-05 MED ORDER — OXYCODONE-ACETAMINOPHEN 5-325 MG PO TABS: 1.0000 | ORAL_TABLET | ORAL | Status: DC | PRN

## 2014-12-05 MED ORDER — KETOROLAC TROMETHAMINE 30 MG/ML IJ SOLN
30.0000 mg | Freq: Once | INTRAMUSCULAR | Status: AC
  Administered 2014-12-05: 30 mg via INTRAVENOUS

## 2014-12-05 MED ORDER — HYDROMORPHONE HCL 1 MG/ML IJ SOLN
1.0000 mg | Freq: Once | INTRAMUSCULAR | Status: AC
  Administered 2014-12-05: 1 mg via INTRAVENOUS

## 2014-12-05 MED ORDER — KETOROLAC TROMETHAMINE 30 MG/ML IJ SOLN
INTRAMUSCULAR | Status: AC
Start: 2014-12-05 — End: 2014-12-05
  Administered 2014-12-05: 30 mg via INTRAVENOUS
  Filled 2014-12-05: qty 1

## 2014-12-05 MED ORDER — HYDROMORPHONE HCL 1 MG/ML IJ SOLN
INTRAMUSCULAR | Status: AC
  Administered 2014-12-05: 1 mg via INTRAVENOUS
  Filled 2014-12-05: qty 1

## 2014-12-05 NOTE — Discharge Instructions (Signed)

## 2014-12-05 NOTE — ED Notes (Signed)
Pt alert and in NAD at time of d/c to family. 

## 2014-12-06 ENCOUNTER — Telehealth: Payer: Self-pay | Admitting: Emergency Medicine

## 2014-12-06 NOTE — ED Notes (Signed)
Called patient per request of ed Diplomatic Services operational officersecretary.  Pt was referred to the neuro moonlighter--no office.  Ed sec told patietn ot call [facility].  i left message asking her to call me if she was having any trouble getting appt there.

## 2014-12-17 DIAGNOSIS — R519 Headache, unspecified: Secondary | ICD-10-CM | POA: Insufficient documentation

## 2015-05-08 ENCOUNTER — Emergency Department (HOSPITAL_COMMUNITY): Payer: Self-pay

## 2015-05-08 ENCOUNTER — Encounter (HOSPITAL_COMMUNITY): Payer: Self-pay | Admitting: Emergency Medicine

## 2015-05-08 ENCOUNTER — Emergency Department (HOSPITAL_COMMUNITY)
Admission: EM | Admit: 2015-05-08 | Discharge: 2015-05-08 | Disposition: A | Discharge status: Home / Self Care | Payer: Self-pay | Attending: Emergency Medicine | Admitting: Emergency Medicine

## 2015-05-08 DIAGNOSIS — M79671 Pain in right foot: Secondary | ICD-10-CM | POA: Insufficient documentation

## 2015-05-08 DIAGNOSIS — Z880 Allergy status to penicillin: Secondary | ICD-10-CM | POA: Insufficient documentation

## 2015-05-08 DIAGNOSIS — M25474 Effusion, right foot: Secondary | ICD-10-CM | POA: Insufficient documentation

## 2015-05-08 DIAGNOSIS — Z87891 Personal history of nicotine dependence: Secondary | ICD-10-CM | POA: Insufficient documentation

## 2015-05-08 DIAGNOSIS — M549 Dorsalgia, unspecified: Secondary | ICD-10-CM | POA: Insufficient documentation

## 2015-05-08 MED ORDER — HYDROCODONE-ACETAMINOPHEN 5-325 MG PO TABS: 1.0000 | ORAL_TABLET | ORAL | Status: DC | PRN

## 2015-05-08 MED ORDER — IBUPROFEN 800 MG PO TABS
800.0000 mg | ORAL_TABLET | Freq: Once | ORAL | Status: AC
  Administered 2015-05-08: 800 mg via ORAL
  Filled 2015-05-08: qty 1

## 2015-05-08 MED ORDER — HYDROCODONE-ACETAMINOPHEN 5-325 MG PO TABS
1.0000 | ORAL_TABLET | Freq: Once | ORAL | Status: AC
  Administered 2015-05-08: 1 via ORAL
  Filled 2015-05-08: qty 1

## 2015-05-08 NOTE — ED Notes (Signed)
Patient started to have pain in right foot pain yesterday. Patient states it is very painful to walk on it. Right ankle is swollen compared to left foot.

## 2015-05-08 NOTE — ED Provider Notes (Signed)
CSN: [id number]     Arrival date & time 05/08/15  2015 History  By signing my name below, I, Soijett Blue, attest that this documentation has been prepared under the direction and in the presence of Elpidio Anis, PA-C Electronically Signed: Soijett Blue, ED Scribe. 05/08/2015. 10:17 PM.   Chief Complaint  Patient presents with  . Foot Pain    right foot pain radiating up the leg per patient      The history is provided by the patient. No language interpreter was used.    Kelly Haley is a 34 y.o. female who presents to the Emergency Department complaining of right foot pain onset yesterday. She notes that her right foot pain has been radiating up the right leg. She reports that her right foot is painful to walk on. She denies any recent trauma/injury/fall to her foot. She reports that she is otherwise healthy. Pt is having associated symptoms of right ankle pain/swelling. She notes that she has tried OTC 800 mg ibuprofen with her last dose this morning for the relief of her symptoms. She denies color change, wound, rash, fever, numbness, calf pain, and any other symptoms. [facility] [place], [place].   Past Medical History  Diagnosis Date  . Medical history non-contributory    Past Surgical History  Procedure Laterality Date  . Tubal ligation    . Carpal tunnel release    . Carpal tunnel release     History reviewed. No pertinent family history. Social History  Substance Use Topics  . Smoking status: Former Games developer  . Smokeless tobacco: Never Used  . Alcohol Use: No   OB History    Gravida Para Term Preterm AB TAB SAB Ectopic Multiple Living   Review of Systems  Constitutional: Negative for fever.  Musculoskeletal: Positive for back pain and joint swelling.  Skin: Negative for color change, rash and wound.  Neurological: Negative for numbness.      Allergies  Penicillins  Home Medications   Prior to  Admission medications   Medication Sig Start Date End Date Taking? Authorizing Provider  acetaminophen (TYLENOL) 500 MG tablet Take 1,000 mg by mouth every 6 (six) hours as needed for pain.   Yes Historical Provider, MD  ibuprofen (ADVIL,MOTRIN) 200 MG tablet Take 800 mg by mouth every 6 (six) hours as needed for moderate pain.   Yes Historical Provider, MD  cyclobenzaprine (FLEXERIL) 10 MG tablet Take 1 tablet (10 mg total) by mouth 2 (two) times daily as needed for muscle spasms. Patient not taking: Reported on 05/08/2015 04/10/14   Marny Lowenstein, PA-C  ibuprofen (ADVIL,MOTRIN) 600 MG tablet Take 1 tablet (600 mg total) by mouth every 6 (six) hours as needed. Patient not taking: Reported on 05/08/2015 04/10/14   Marny Lowenstein, PA-C  oxyCODONE-acetaminophen (PERCOCET/ROXICET) 5-325 MG per tablet Take 1 tablet by mouth every 8 (eight) hours as needed for severe pain. Patient not taking: Reported on 05/08/2015 02/27/14   Zadie Rhine, MD  oxyCODONE-acetaminophen (ROXICET) 5-325 MG per tablet Take 1 tablet by mouth every 4 (four) hours as needed for severe pain. Patient not taking: Reported on 05/08/2015 12/05/14   Darci Current, MD   BP 124/81 mmHg  Pulse 101  Temp(Src) 98.2 F (36.8 C) (Oral)  Resp 18  Ht  (1.6 m)  Wt 210 lb (95.255 kg)  BMI 37.21 kg/m2  SpO2 100%  LMP 05/08/2015 Physical Exam  Constitutional: She is oriented to person, place, and time. She appears well-developed and well-nourished. No distress.  HENT:  Head: Normocephalic and atraumatic.  Eyes: EOM are normal.  Neck: Neck supple.  Cardiovascular: Normal rate and intact distal pulses.   Pulmonary/Chest: Effort normal. No respiratory distress.  Abdominal: Soft. There is no tenderness.  Musculoskeletal: Normal range of motion.  Right foot: minimally swollen. No discoloration. Bo bony deformity. No redness or warmth diffusely tender with full ROM of all joints.   Neurological: She is alert and oriented to  person, place, and time.  Skin: Skin is warm and dry.  No wound, ulceration or blistering.   Psychiatric: She has a normal mood and affect. Her behavior is normal.  Nursing note and vitals reviewed.   ED Course  Procedures (including critical care time) DIAGNOSTIC STUDIES: Oxygen Saturation is 100% on RA, nl by my interpretation.    COORDINATION OF CARE: 10:15 PM Discussed treatment plan with pt at bedside which includes anti-inflammatory Rx, pain medication Rx, crutches, RICE and pt agreed to plan.    Labs Review Labs Reviewed - No data to display  Imaging Review Dg Foot Complete Right  05/08/2015  CLINICAL DATA:  Right foot pain for 2 days.  No known injury. EXAM: RIGHT FOOT COMPLETE - 3+ VIEW COMPARISON:  None. FINDINGS: No fracture or dislocation. 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 No erosions. No significant hallux valgus deformity. Moderate-sized plantar calcaneal spur. IMPRESSION: 1. No acute findings. 2. Mild degenerative change of the first MTP joint without associated hallux valgus deformity. 3. Moderate-sized plantar calcaneal spur. Electronically Signed   By: Simonne ComeJohn  Watts M.D.   On: 05/08/2015 21:28   I have personally reviewed and evaluated these images as part of my medical decision-making.   EKG Interpretation None      MDM   Final diagnoses:  None    1. Right foot pain  Negative imaging, atraumatic foot pain. No evidence of infection. Post-op shoe and crutches provided. Ortho prn.  I personally performed the services described in this documentation, which was scribed in my presence. The recorded information has been reviewed and is accurate.     Elpidio AnisShari Caridad Silveira, PA-C 05/11/15 0111  Leta BaptistEmily Roe Nguyen, MD 05/12/15 [phone]

## 2015-05-08 NOTE — Discharge Instructions (Signed)

## 2015-12-08 ENCOUNTER — Emergency Department (HOSPITAL_COMMUNITY)
Admission: EM | Admit: 2015-12-08 | Discharge: 2015-12-08 | Disposition: A | Discharge status: Home / Self Care | Payer: Self-pay | Attending: Emergency Medicine | Admitting: Emergency Medicine

## 2015-12-08 ENCOUNTER — Encounter (HOSPITAL_COMMUNITY): Payer: Self-pay | Admitting: Emergency Medicine

## 2015-12-08 DIAGNOSIS — R350 Frequency of micturition: Secondary | ICD-10-CM | POA: Insufficient documentation

## 2015-12-08 DIAGNOSIS — Z79899 Other long term (current) drug therapy: Secondary | ICD-10-CM | POA: Insufficient documentation

## 2015-12-08 DIAGNOSIS — R110 Nausea: Secondary | ICD-10-CM | POA: Insufficient documentation

## 2015-12-08 DIAGNOSIS — B373 Candidiasis of vulva and vagina: Secondary | ICD-10-CM | POA: Insufficient documentation

## 2015-12-08 DIAGNOSIS — Z87891 Personal history of nicotine dependence: Secondary | ICD-10-CM | POA: Insufficient documentation

## 2015-12-08 DIAGNOSIS — R1030 Lower abdominal pain, unspecified: Secondary | ICD-10-CM | POA: Insufficient documentation

## 2015-12-08 DIAGNOSIS — B3731 Acute candidiasis of vulva and vagina: Secondary | ICD-10-CM

## 2015-12-08 DIAGNOSIS — N390 Urinary tract infection, site not specified: Secondary | ICD-10-CM | POA: Insufficient documentation

## 2015-12-08 DIAGNOSIS — N898 Other specified noninflammatory disorders of vagina: Secondary | ICD-10-CM | POA: Insufficient documentation

## 2015-12-08 LAB — URINE MICROSCOPIC-ADD ON

## 2015-12-08 LAB — URINALYSIS, ROUTINE W REFLEX MICROSCOPIC
Bilirubin Urine: NEGATIVE
Glucose, UA: NEGATIVE mg/dL
Ketones, ur: NEGATIVE mg/dL
Nitrite: NEGATIVE
PROTEIN: NEGATIVE mg/dL
Specific Gravity, Urine: 1.017 (ref 1.005–1.030)
pH: 6 (ref 5.0–8.0)

## 2015-12-08 MED ORDER — ONDANSETRON HCL 8 MG PO TABS: 8.0000 mg | ORAL_TABLET | Freq: Three times a day (TID) | ORAL | Status: DC | PRN

## 2015-12-08 MED ORDER — FLUCONAZOLE 150 MG PO TABS
150.0000 mg | ORAL_TABLET | Freq: Once | ORAL | Status: DC
Start: 2015-12-08 — End: 2017-02-14

## 2015-12-08 MED ORDER — SULFAMETHOXAZOLE-TRIMETHOPRIM 800-160 MG PO TABS: 1.0000 | ORAL_TABLET | Freq: Two times a day (BID) | ORAL | Status: DC

## 2015-12-08 MED ORDER — FLUCONAZOLE 150 MG PO TABS
150.0000 mg | ORAL_TABLET | Freq: Once | ORAL | Status: AC
  Administered 2015-12-08: 150 mg via ORAL
  Filled 2015-12-08: qty 1

## 2015-12-08 NOTE — ED Notes (Signed)
Patient states she believes she has "a kidney infection or a bladder infection" Reports nausea, flank pain, dysuria, frequency Denies fever, vomiting, diarrhea Patient also states "I think I have a yeast infection. It's been really itchy for the past 2 days"

## 2015-12-08 NOTE — Discharge Instructions (Signed)
Stay very well hydrated with plenty of water throughout the day. Take antibiotic until completed. After you're done with the antibiotic, take the diflucan tablet to ensure you completely treat the yeast infection as well. May consider over-the-counter Pyridium for pain relief, but don't take this longer than 3 days, and be aware that it may turn your urine bright orange. This is a harmless side effect. Use zofran as directed as needed for nausea. Use tylenol or motrin as needed for pain. Follow up with your primary care physician in 1 week for recheck of ongoing symptoms but return to ER for emergent changing or worsening of symptoms. Please seek immediate care if you develop the following: You develop back pain.  Your symptoms are no better, or worse in 3 days. There is severe back pain or lower abdominal pain.  You develop chills.  You have a fever.  There is nausea or vomiting.  There is continued burning or discomfort with urination.    Urinary Tract Infection A urinary tract infection (UTI) can occur any place along the urinary tract. The tract includes the kidneys, ureters, bladder, and urethra. A type of germ called bacteria often causes a UTI. UTIs are often helped with antibiotic medicine.  HOME CARE   If given, take antibiotics as told by your doctor. Finish them even if you start to feel better.  Drink enough fluids to keep your pee (urine) clear or pale yellow.  Avoid tea, drinks with caffeine, and bubbly (carbonated) drinks.  Pee often. Avoid holding your pee in for a long time.  Pee before and after having sex (intercourse).  Wipe from front to back after you poop (bowel movement) if you are a woman. Use each tissue only once. GET HELP RIGHT AWAY IF:   You have back pain.  You have lower belly (abdominal) pain.  You have chills.  You feel sick to your stomach (nauseous).  You throw up (vomit).  Your burning or discomfort with peeing does not go away.  You have a  fever.  Your symptoms are not better in 3 days. MAKE SURE YOU:   Understand these instructions.  Will watch your condition.  Will get help right away if you are not doing well or get worse.   This information is not intended to replace advice given to you by your health care provider. Make sure you discuss any questions you have with your health care provider.   Document Released: 12/22/2007 Document Revised: 07/26/2014 Document Reviewed: 02/03/2012 Elsevier Interactive Patient Education 2016 Elsevier Inc.  Monilial Vaginitis Vaginitis in a soreness, swelling and redness (inflammation) of the vagina and vulva. Monilial vaginitis is not a sexually transmitted infection. CAUSES  Yeast vaginitis is caused by yeast (candida) that is normally found in your vagina. With a yeast infection, the candida has overgrown in number to a point that upsets the chemical balance. SYMPTOMS   White, thick vaginal discharge.  Swelling, itching, redness and irritation of the vagina and possibly the lips of the vagina (vulva).  Burning or painful urination.  Painful intercourse. DIAGNOSIS  Things that may contribute to monilial vaginitis are:  Postmenopausal and virginal states.  Pregnancy.  Infections.  Being tired, sick or stressed, especially if you had monilial vaginitis in the past.  Diabetes. Good control will help lower the chance.  Birth control pills.  Tight fitting garments.  Using bubble bath, feminine sprays, douches or deodorant tampons.  Taking certain medications that kill germs (antibiotics).  Sporadic recurrence can occur  if you become ill. TREATMENT  Your caregiver will give you medication.  There are several kinds of anti monilial vaginal creams and suppositories specific for monilial vaginitis. For recurrent yeast infections, use a suppository or cream in the vagina 2 times a week, or as directed.  Anti-monilial or steroid cream for the itching or irritation of  the vulva may also be used. Get your caregiver's permission.  Painting the vagina with methylene blue solution may help if the monilial cream does not work.  Eating yogurt may help prevent monilial vaginitis. HOME CARE INSTRUCTIONS   Finish all medication as prescribed.  Do not have sex until treatment is completed or after your caregiver tells you it is okay.  Take warm sitz baths.  Do not douche.  Do not use tampons, especially scented ones.  Wear cotton underwear.  Avoid tight pants and panty hose.  Tell your sexual partner that you have a yeast infection. They should go to their caregiver if they have symptoms such as mild rash or itching.  Your sexual partner should be treated as well if your infection is difficult to eliminate.  Practice safer sex. Use condoms.  Some vaginal medications cause latex condoms to fail. Vaginal medications that harm condoms are:  Cleocin cream.  Butoconazole (Femstat).  Terconazole (Terazol) vaginal suppository.  Miconazole (Monistat) (may be purchased over the counter). SEEK MEDICAL CARE IF:   You have a temperature by mouth above 102 F (38.9 C).  The infection is getting worse after 2 days of treatment.  The infection is not getting better after 3 days of treatment.  You develop blisters in or around your vagina.  You develop vaginal bleeding, and it is not your menstrual period.  You have pain when you urinate.  You develop intestinal problems.  You have pain with sexual intercourse.   This information is not intended to replace advice given to you by your health care provider. Make sure you discuss any questions you have with your health care provider.   Document Released: 04/14/2005 Document Revised: 09/27/2011 Document Reviewed: 01/06/2015 Elsevier Interactive Patient Education 2016 Elsevier Inc.  Nausea, Adult Nausea means you feel sick to your stomach or need to throw up (vomit). It may be a sign of a more  serious problem. If nausea gets worse, you may throw up. If you throw up a lot, you may lose too much body fluid (dehydration). HOME CARE   Get plenty of rest.  Ask your doctor how to replace body fluid losses (rehydrate).  Eat small amounts of food. Sip liquids more often.  Take all medicines as told by your doctor. GET HELP RIGHT AWAY IF:  You have a fever.  You pass out (faint).  You keep throwing up or have blood in your throw up.  You are very weak, have dry lips or a dry mouth, or you are very thirsty (dehydrated).  You have dark or bloody poop (stool).  You have very bad chest or belly (abdominal) pain.  You do not get better after 2 days, or you get worse.  You have a headache. MAKE SURE YOU:  Understand these instructions.  Will watch your condition.  Will get help right away if you are not doing well or get worse.   This information is not intended to replace advice given to you by your health care provider. Make sure you discuss any questions you have with your health care provider.   Document Released: 06/24/2011 Document Revised: 09/27/2011 Document Reviewed:  06/24/2011 Elsevier Interactive Patient Education [employer]

## 2015-12-08 NOTE — ED Provider Notes (Signed)
CSN: [id number]     Arrival date & time 12/08/15  1756 History   First MD Initiated Contact with Patient 12/08/15 2234     Chief Complaint  Patient presents with  . Dysuria  . Vaginal Itching     (Consider location/radiation/quality/duration/timing/severity/associated sxs/prior Treatment) HPI Comments: Kelly Haley is a 35 y.o. female with a PSHx of tubal ligation, who presents to the ED with complaints of several months of dysuria, increased urinary frequency and urgency, hesitancy, and suprapubic abdominal pressure that has worsened over the last 3-4 days. She states she thinks she has a bladder infection or a kidney infection, has not had these before but her sister has and the symptoms are similar to what her sister has experienced. She describes her suprapubic abdominal pain as 4/10 intermittent pressure-like pain radiating into her lower back, worse with urination especially in the mornings, and unrelieved with ibuprofen and Tylenol. Additional associated symptoms include intermittent nausea, and 2 days of vaginal itching which she believes to be due to a yeast infection. She is sexually active with one female partner, unprotected. LMP 2 weeks ago.  She denies any fevers, chills, chest pain, shortness breath, vomiting, diarrhea, constipation, obstipation, melena, hematochezia, vaginal discharge, vaginal bleeding, hematuria, genital lesions, numbness, tingling, or focal weakness.  Patient is a 35 y.o. female presenting with dysuria and vaginal itching. The history is provided by the patient. No language interpreter was used.  Dysuria Quality: pressure. Pain severity:  Moderate Onset quality:  Gradual Duration:  4 days (several months but worse x3-4 days) Timing:  Intermittent Progression:  Waxing and waning Chronicity:  New Recent urinary tract infections: no   Relieved by:  Nothing Worsened by:  Nothing tried Ineffective treatments:  Acetaminophen and NSAIDs Urinary symptoms:  frequent urination and hesitancy   Urinary symptoms: no hematuria   Associated symptoms: abdominal pain (suprapubic pressure) and nausea (intermittent, none ongoing)   Associated symptoms: no fever, no vaginal discharge and no vomiting   Risk factors: sexually active   Risk factors: not pregnant and no recurrent urinary tract infections   Vaginal Itching Associated symptoms include abdominal pain (suprapubic pressure) and nausea (intermittent, none ongoing). Pertinent negatives include no arthralgias, chest pain, chills, fever, myalgias, numbness, vomiting or weakness.    Past Medical History  Diagnosis Date  . Medical history non-contributory    Past Surgical History  Procedure Laterality Date  . Tubal ligation    . Carpal tunnel release    . Carpal tunnel release     No family history on file. Social History  Substance Use Topics  . Smoking status: Former Games developer  . Smokeless tobacco: Never Used  . Alcohol Use: No   OB History    Gravida Para Term Preterm AB TAB SAB Ectopic Multiple Living   4 3 3  1 1    3      Review of Systems  Constitutional: Negative for fever and chills.  Respiratory: Negative for shortness of breath.   Cardiovascular: Negative for chest pain.  Gastrointestinal: Positive for nausea (intermittent, none ongoing) and abdominal pain (suprapubic pressure). Negative for vomiting, diarrhea, constipation and blood in stool.  Genitourinary: Positive for dysuria, urgency, frequency and vaginal pain (itching). Negative for hematuria, vaginal bleeding, vaginal discharge and genital sores.  Musculoskeletal: Negative for myalgias and arthralgias.  Skin: Negative for color change.  Allergic/Immunologic: Negative for immunocompromised state.  Neurological: Negative for weakness and numbness.  Psychiatric/Behavioral: Negative for confusion.   10 Systems reviewed and are negative for  acute change except as noted in the HPI.    Allergies  Penicillins  Home  Medications   Prior to Admission medications   Medication Sig Start Date End Date Taking? Authorizing Provider  acetaminophen (TYLENOL) 500 MG tablet Take 1,000 mg by mouth every 6 (six) hours as needed for pain.   Yes Historical Provider, MD  ibuprofen (ADVIL,MOTRIN) 200 MG tablet Take 800 mg by mouth every 6 (six) hours as needed for moderate pain.   Yes Historical Provider, MD  cyclobenzaprine (FLEXERIL) 10 MG tablet Take 1 tablet (10 mg total) by mouth 2 (two) times daily as needed for muscle spasms. Patient not taking: Reported on 05/08/2015 04/10/14   Marny Lowenstein, PA-C  HYDROcodone-acetaminophen (NORCO/VICODIN) 5-325 MG tablet Take 1-2 tablets by mouth every 4 (four) hours as needed. 05/08/15   Elpidio Anis, PA-C  ibuprofen (ADVIL,MOTRIN) 600 MG tablet Take 1 tablet (600 mg total) by mouth every 6 (six) hours as needed. Patient not taking: Reported on 05/08/2015 04/10/14   Marny Lowenstein, PA-C  oxyCODONE-acetaminophen (PERCOCET/ROXICET) 5-325 MG per tablet Take 1 tablet by mouth every 8 (eight) hours as needed for severe pain. Patient not taking: Reported on 05/08/2015 02/27/14   Zadie Rhine, MD  oxyCODONE-acetaminophen (ROXICET) 5-325 MG per tablet Take 1 tablet by mouth every 4 (four) hours as needed for severe pain. Patient not taking: Reported on 05/08/2015 12/05/14   Darci Current, MD   BP 121/75 mmHg  Pulse 80  Temp(Src) 98.7 F (37.1 C) (Oral)  Resp 18  Ht  (1.651 m)  Wt 95.255 kg  BMI 34.95 kg/m2  SpO2 98%  LMP 11/24/2015 (Approximate) Physical Exam  Constitutional: She is oriented to person, place, and time. Vital signs are normal. She appears well-developed and well-nourished.  Non-toxic appearance. No distress.  Afebrile, nontoxic, NAD  HENT:  Head: Normocephalic and atraumatic.  Mouth/Throat: Oropharynx is clear and moist and mucous membranes are normal.  Eyes: Conjunctivae and EOM are normal. Right eye exhibits no discharge. Left eye exhibits no  discharge.  Neck: Normal range of motion. Neck supple.  Cardiovascular: Normal rate, regular rhythm, normal heart sounds and intact distal pulses.  Exam reveals no gallop and no friction rub.   No murmur heard. Pulmonary/Chest: Effort normal and breath sounds normal. No respiratory distress. She has no decreased breath sounds. She has no wheezes. She has no rhonchi. She has no rales.  Abdominal: Soft. Normal appearance and bowel sounds are normal. She exhibits no distension. There is tenderness in the suprapubic area. There is no rigidity, no rebound, no guarding, no CVA tenderness, no tenderness at McBurney's point and negative Murphy's sign.    Soft, nondistended, +BS throughout, with mild suprapubic TTP, no r/g/r, neg murphy's, neg mcburney's, no CVA TTP   Genitourinary:  Deferred due to pt preference  Musculoskeletal: Normal range of motion.  Neurological: She is alert and oriented to person, place, and time. She has normal strength. No sensory deficit.  Skin: Skin is warm, dry and intact. No rash noted.  Psychiatric: She has a normal mood and affect.  Nursing note and vitals reviewed.   ED Course  Procedures (including critical care time) Labs Review Labs Reviewed  URINALYSIS, ROUTINE W REFLEX MICROSCOPIC (NOT AT [facility]) - Abnormal; Notable for the following:    APPearance CLOUDY (*)    Hgb urine dipstick TRACE (*)    Leukocytes, UA LARGE (*)    All other components within normal limits  URINE MICROSCOPIC-ADD ON - Abnormal;  Notable for the following:    Squamous Epithelial / LPF 6-30 (*)    Bacteria, UA MANY (*)    All other components within normal limits  URINE CULTURE    Imaging Review No results found. I have personally reviewed and evaluated these images and lab results as part of my medical decision-making.   EKG Interpretation None      MDM   Final diagnoses:  UTI (lower urinary tract infection)  Nausea  Suprapubic abdominal pain, unspecified laterality    Urinary frequency  Yeast vaginitis    35 y.o. female here with several months of dysuria, suprapubic pressure pain, increased urinary frequency and urgency, which have worsened over the last 3-4 days. She states she's not sure whether she has a bladder or kidney infection. 2 days of vaginal itching as well, thinks she has his infection. On exam, mild suprapubic tenderness, no flank tenderness, non-peritoneal abdominal exam. Afebrile and nontoxic-appearing. UA with cloudy urine, large leuk trase, 6-30 WBCs and squamous cells, many bacteria, and yeast present. Given symptoms, will treat for UTI, but discussed that interstitial cystitis could be the cause ultimately since it seems that this is an ongoing issue for several months and a UTI typically doesn't last that long. Will send for culture. Given that the yeast was found on the urine, and that patient prefers not have a pelvic exam, I feel it is reasonable to treat for yeast without proceeding with pelvic exam or STD check. Will give Diflucan once here, and 1 tablet to take after she finishes the course of antibiotics. Discussed Tylenol and Motrin for pain control, Zofran for nausea, and follow-up with PCP in 1 week. If symptoms persist after suspected UTI is treated, she may need urologic follow-up to workup whether she has interstitial cystitis or other causes. I explained the diagnosis and have given explicit precautions to return to the ER including for any other new or worsening symptoms. The patient understands and accepts the medical plan as it's been dictated and I have answered their questions. Discharge instructions concerning home care and prescriptions have been given. The patient is STABLE and is discharged to home in good condition.   BP 130/62 mmHg  Pulse 82  Temp(Src) 98.3 F (36.8 C) (Oral)  Resp 14  Ht 5\' 5"  (1.651 m)  Wt 95.255 kg  BMI 34.95 kg/m2  SpO2 100%  LMP 11/24/2015 (Approximate)  Meds ordered this encounter   Medications  . fluconazole (DIFLUCAN) tablet 150 mg    Sig:   . sulfamethoxazole-trimethoprim (BACTRIM DS,SEPTRA DS) 800-160 MG tablet    Sig: Take 1 tablet by mouth 2 (two) times daily. x5 days    Dispense:  10 tablet    Refill:  0    Order Specific Question:  Supervising Provider    Answer:  Hyacinth MeekerMILLER, BRIAN [[id number]]  . ondansetron (ZOFRAN) 8 MG tablet    Sig: Take 1 tablet (8 mg total) by mouth every 8 (eight) hours as needed for nausea or vomiting.    Dispense:  10 tablet    Refill:  0    Order Specific Question:  Supervising Provider    Answer:  Hyacinth MeekerMILLER, BRIAN [[id number]]  . fluconazole (DIFLUCAN) 150 MG tablet    Sig: Take 1 tablet (150 mg total) by mouth once. TAKE AFTER YOU'VE FINISHED THE ANTIBIOTIC (BACTRIM)    Dispense:  1 tablet    Refill:  0    Order Specific Question:  Supervising Provider    Answer:  Eber HongMILLER, BRIAN [  [id number]]     Allen Derry, PA-C 12/08/15 2321  Alvira Monday, MD 12/10/15 1352

## 2015-12-10 LAB — URINE CULTURE: SPECIAL REQUESTS: NORMAL

## 2016-02-14 ENCOUNTER — Encounter: Payer: Self-pay | Admitting: *Deleted

## 2016-02-14 ENCOUNTER — Emergency Department
Admission: EM | Admit: 2016-02-14 | Discharge: 2016-02-15 | Disposition: A | Discharge status: Home / Self Care | Payer: Self-pay | Attending: Student | Admitting: Student

## 2016-02-14 ENCOUNTER — Emergency Department: Payer: Self-pay

## 2016-02-14 DIAGNOSIS — R51 Headache: Secondary | ICD-10-CM | POA: Insufficient documentation

## 2016-02-14 DIAGNOSIS — R519 Headache, unspecified: Secondary | ICD-10-CM

## 2016-02-14 DIAGNOSIS — Z87891 Personal history of nicotine dependence: Secondary | ICD-10-CM | POA: Insufficient documentation

## 2016-02-14 LAB — CBC WITH DIFFERENTIAL/PLATELET
Basophils Absolute: 0.1 10*3/uL (ref 0–0.1)
Basophils Relative: 1 %
EOS PCT: 1 %
Eosinophils Absolute: 0.1 10*3/uL (ref 0–0.7)
HCT: 42.7 % (ref 35.0–47.0)
Hemoglobin: 14.6 g/dL (ref 12.0–16.0)
Lymphocytes Relative: 31 %
Lymphs Abs: 3.8 10*3/uL — ABNORMAL HIGH (ref 1.0–3.6)
MCH: 27.9 pg (ref 26.0–34.0)
MCHC: 34.2 g/dL (ref 32.0–36.0)
MCV: 81.6 fL (ref 80.0–100.0)
MONO ABS: 0.6 10*3/uL (ref 0.2–0.9)
MONOS PCT: 5 %
Neutro Abs: 7.5 10*3/uL — ABNORMAL HIGH (ref 1.4–6.5)
Neutrophils Relative %: 62 %
Platelets: 349 10*3/uL (ref 150–440)
RBC: 5.24 MIL/uL — ABNORMAL HIGH (ref 3.80–5.20)
RDW: 13.7 % (ref 11.5–14.5)
WBC: 12.1 10*3/uL — ABNORMAL HIGH (ref 3.6–11.0)

## 2016-02-14 LAB — COMPREHENSIVE METABOLIC PANEL
ALBUMIN: 4.6 g/dL (ref 3.5–5.0)
ALT: 25 U/L (ref 14–54)
ANION GAP: 9 (ref 5–15)
AST: 26 U/L (ref 15–41)
Alkaline Phosphatase: 83 U/L (ref 38–126)
BUN: 11 mg/dL (ref 6–20)
CO2: 26 mmol/L (ref 22–32)
Calcium: 9.8 mg/dL (ref 8.9–10.3)
Chloride: 107 mmol/L (ref 101–111)
Creatinine, Ser: 0.61 mg/dL (ref 0.44–1.00)
GFR calc Af Amer: >60 mL/min (ref >60)
GFR calc non Af Amer: >60 mL/min (ref >60)
GLUCOSE: 88 mg/dL (ref 65–99)
POTASSIUM: 3.4 mmol/L — AB (ref 3.5–5.1)
SODIUM: 142 mmol/L (ref 135–145)
TOTAL PROTEIN: 8.3 g/dL — AB (ref 6.5–8.1)
Total Bilirubin: 0.6 mg/dL (ref 0.3–1.2)

## 2016-02-14 LAB — POCT PREGNANCY, URINE: PREG TEST UR: NEGATIVE

## 2016-02-14 MED ORDER — SODIUM CHLORIDE 0.9 % IV BOLUS (SEPSIS)
1000.0000 mL | Freq: Once | INTRAVENOUS | Status: AC
  Administered 2016-02-15: 1000 mL via INTRAVENOUS

## 2016-02-14 MED ORDER — METOCLOPRAMIDE HCL 5 MG/ML IJ SOLN
10.0000 mg | Freq: Once | INTRAMUSCULAR | Status: AC
  Administered 2016-02-15: 10 mg via INTRAVENOUS
  Filled 2016-02-14: qty 2

## 2016-02-14 MED ORDER — KETOROLAC TROMETHAMINE 30 MG/ML IJ SOLN
15.0000 mg | Freq: Once | INTRAMUSCULAR | Status: AC
  Administered 2016-02-15: 15 mg via INTRAVENOUS
  Filled 2016-02-14: qty 1

## 2016-02-14 MED ORDER — DIPHENHYDRAMINE HCL 50 MG/ML IJ SOLN
12.5000 mg | Freq: Once | INTRAMUSCULAR | Status: AC
  Administered 2016-02-15: 12.5 mg via INTRAVENOUS
  Filled 2016-02-14: qty 1

## 2016-02-14 NOTE — ED Provider Notes (Addendum)
[facility] Emergency Department Provider Note   ____________________________________________   First MD Initiated Contact with Patient 02/14/16 2312     (approximate)  I have reviewed the triage vital signs and the nursing notes.   HISTORY  Chief Complaint Facial Pain    HPI [name] is a 35 y.o. female with chronic right-sided headaches since 2006 who presents for evaluation of worsening right-sided headache over the past several weeks, gradual onset, constant, no modifying factors, currently moderate to severe. Patient reports that she has had long-standing headaches since 2006 but she does not know why. She reports that for over a year and a half she has also had pulsatile tinnitus in the right ear and when her headache is severe, the tinnitus is also severe. She was seen by ENT for this last year. She is also been seen by Dr. Malvin Johns of neurology, initially was on Topamax however she self discontinue this medication because it made her feel "jittery". She denies any numbness, weakness, vomiting, diarrhea, fevers or chills. She reports that for over a year and a half, she has intermittently had some mild blurring of the vision in her right eye associated with headache. No chest pain difficulty breathing. No Fevers or chills. No neck pain or neck stiffness.   Past Medical History:  Diagnosis Date  . Medical history non-contributory     There are no active problems to display for this patient.   Past Surgical History:  Procedure Laterality Date  . CARPAL TUNNEL RELEASE    . CARPAL TUNNEL RELEASE    . TUBAL LIGATION      Prior to Admission medications   Medication Sig Start Date End Date Taking? Authorizing Provider  acetaminophen (TYLENOL) 500 MG tablet Take 1,000 mg by mouth every 6 (six) hours as needed for pain.    Historical Provider, MD  cyclobenzaprine (FLEXERIL) 10 MG tablet Take 1 tablet (10 mg total) by mouth 2 (two) times daily as  needed for muscle spasms. 04/10/14   Marny Lowenstein, PA-C  fluconazole (DIFLUCAN) 150 MG tablet Take 1 tablet (150 mg total) by mouth once. TAKE AFTER YOU'VE FINISHED THE ANTIBIOTIC (BACTRIM) 12/08/15   Mercedes Camprubi-Soms, PA-C  HYDROcodone-acetaminophen (NORCO/VICODIN) 5-325 MG tablet Take 1-2 tablets by mouth every 4 (four) hours as needed. 05/08/15   Elpidio Anis, PA-C  ibuprofen (ADVIL,MOTRIN) 200 MG tablet Take 800 mg by mouth every 6 (six) hours as needed for moderate pain.    Historical Provider, MD  ibuprofen (ADVIL,MOTRIN) 600 MG tablet Take 1 tablet (600 mg total) by mouth every 6 (six) hours as needed. Patient not taking: Reported on 05/08/2015 04/10/14   Marny Lowenstein, PA-C  ondansetron (ZOFRAN) 8 MG tablet Take 1 tablet (8 mg total) by mouth every 8 (eight) hours as needed for nausea or vomiting. 12/08/15   Mercedes Camprubi-Soms, PA-C  oxyCODONE-acetaminophen (PERCOCET/ROXICET) 5-325 MG per tablet Take 1 tablet by mouth every 8 (eight) hours as needed for severe pain. Patient not taking: Reported on 05/08/2015 02/27/14   Zadie Rhine, MD  oxyCODONE-acetaminophen (ROXICET) 5-325 MG per tablet Take 1 tablet by mouth every 4 (four) hours as needed for severe pain. Patient not taking: Reported on 05/08/2015 12/05/14   Darci Current, MD  sulfamethoxazole-trimethoprim (BACTRIM DS,SEPTRA DS) 800-160 MG tablet Take 1 tablet by mouth 2 (two) times daily. x5 days 12/08/15   Mercedes Camprubi-Soms, PA-C    Allergies Penicillins  History reviewed. No pertinent family history.  Social History Social  History  Substance Use Topics  . Smoking status: Former Games developer  . Smokeless tobacco: Never Used  . Alcohol use No    Review of Systems Constitutional: No fever/chills Eyes: No visual changes. ENT: No sore throat. Cardiovascular: Denies chest pain. Respiratory: Denies shortness of breath. Gastrointestinal: No abdominal pain.  No nausea, no vomiting.  No diarrhea.  No  constipation. Genitourinary: Negative for dysuria. Musculoskeletal: Negative for back pain. Skin: Negative for rash. Neurological: Positive for headaches, no focal weakness or numbness.  10-point ROS otherwise negative.  ____________________________________________   PHYSICAL EXAM:  VITAL SIGNS: ED Triage Vitals  Enc Vitals Group     BP 02/14/16 2032 138/77     Pulse Rate 02/14/16 2032 82     Resp 02/14/16 2032 18     Temp 02/14/16 2032 98.2 F (36.8 C)     Temp Source 02/14/16 2032 Oral     SpO2 02/14/16 2032 99 %     Weight 02/14/16 2032 220 lb (99.8 kg)     Height 02/14/16 2032  (1.626 m)     Head Circumference --      Peak Flow --      Pain Score 02/14/16 2034 7     Pain Loc --      Pain Edu? --      Excl. in GC? --     Constitutional: Alert and oriented. Well appearing and in no acute distress. Eyes: Conjunctivae are normal. PERRL. EOMI. Ears: normal TMs bilaterally Head: Atraumatic. Nose: No congestion/rhinnorhea. Mouth/Throat: Mucous membranes are moist.  Oropharynx non-erythematous. Neck: No stridor.   Cardiovascular: Normal rate, regular rhythm. Grossly normal heart sounds.  Good peripheral circulation. Respiratory: Normal respiratory effort.  No retractions. Lungs CTAB. Gastrointestinal: Soft and nontender. No distention.  No CVA tenderness. Genitourinary: deferred Musculoskeletal: No lower extremity tenderness nor edema.  No joint effusions. Neurologic:  Normal speech and language. No gross focal neurologic deficits are appreciated. No gait instability. 5 out of 5 strength in bilateral upper and lower extremities, sensation intact to light touch throughout, cranial nerves II through XII intact, normal finger-nose-finger. Motor ambulation. Skin:  Skin is warm, dry and intact. No rash noted. Psychiatric: Mood and affect are normal. Speech and behavior are normal.  ____________________________________________   LABS (all labs ordered are listed, but  only abnormal results are displayed)  Labs Reviewed  CBC WITH DIFFERENTIAL/PLATELET - Abnormal; Notable for the following:       Result Value   WBC 12.1 (*)    RBC 5.24 (*)    Neutro Abs 7.5 (*)    Lymphs Abs 3.8 (*)    All other components within normal limits  COMPREHENSIVE METABOLIC PANEL - Abnormal; Notable for the following:    Potassium 3.4 (*)    Total Protein 8.3 (*)    All other components within normal limits  POCT PREGNANCY, URINE   ____________________________________________  EKG  none ____________________________________________  RADIOLOGY  CT head IMPRESSION: There is no acute intracranial pathology.  ____________________________________________   PROCEDURES  Procedure(s) performed: None  Procedures  Critical Care performed: No  ____________________________________________   INITIAL IMPRESSION / ASSESSMENT AND PLAN / ED COURSE  Pertinent labs & imaging results that were available during my care of the patient were reviewed by me and considered in my medical decision making (see chart for details).  MALORIE BIGFORD is a 35 y.o. female with chronic right-sided headaches since 2006 who presents for evaluation of her worsening right-sided headache over the past several  weeks as well as pulsatile tinnitus. On exam, she is generally well-appearing and in no acute distress. Her vital signs are stable, she is afebrile. Neck supple without meningismus, I doubt meningitis per she has a completely intact neurological examination without deficit. I reviewed her care everywhere. previously she was seen by an otolaryngologist without clear cause of her tinnitus. this was in 2016, there was a question of whether not this could've represented pseudotumor cerebri as when she had a similar headache in 2006, it was improved after a lumbar puncture. she also was seen by Pr. Malvin Johns of Neurology in may of last year who did not mention pseudotumor cerebri, instead started  her on Topamax and gave her Medrol Dosepak however she did not take those medications. Currently, she has an intact neurological examination, good visual acuity bilaterally.  Clinically, she appears well. Labs and CT scan are generally unremarkable with the exception of mild leukocytosis which is likely pain related. Pregnancy test is negative. We'll treat with migraine cocktail and reassess for disposition.  ----------------------------------------- 1:34 AM on 02/15/2016 ----------------------------------------- Patient reports some improvement of her headache at this time after migraine cocktail. I discussed with her that I think the most likely cause of her symptoms is continued idiopathic intracranial hypertension or pseudotumor cerebri. She is reporting that her headache is improved but not altogether resolved. I have offered additional medications for headache, and I have also offered to perform lumbar puncture as she reports that that was helpful for her once in the past however she has declined both options stating that it is late, she is feeling somewhat improved and she will return for any worsening symptoms. Her vision in both eyes is 20/50. She has normal visual fields on my assessment. Additionally, all the symptoms have been ongoing and worsening for over a year and a half and I do not think she needs an emergent lumbar puncture this evening. We discussed meticulous return precautions, need for close PCP and neurology follow-up and she is comfortable with the discharge plan. DC home.     Clinical Course     ____________________________________________   FINAL CLINICAL IMPRESSION(S) / ED DIAGNOSES  Final diagnoses:  Acute nonintractable headache, unspecified headache type      NEW MEDICATIONS STARTED DURING THIS VISIT:  New Prescriptions   No medications on file     Note:  This document was prepared using Dragon voice recognition software and may include unintentional  dictation errors.    Gayla Doss, MD 02/15/16 9604    Gayla Doss, MD 02/15/16 5409

## 2016-02-14 NOTE — ED Notes (Signed)
Patient with complaint of a headache times one week. Patient reports that she has been taking otc medications with no improvement. Patient states that she has frequent headaches but has never been diagnosed with migraines.

## 2016-02-14 NOTE — ED Triage Notes (Signed)
No injury reported. Pt states year and a half of "dealing with" a heartbeat sound in her R ear. Headaches persisting and worsening since the sound in her ears. Pt states she has seen multiple doctors regarding the sound in her R ear, w/o resolution. Pt states this week she has had new onset of facial pressure and pain w/ accompanying sensation of heaviness in her head. Pt reports worsening pain w/ movement of head and chewing, but not necessarily w/ talking. Pt denies relief from OTC meds. Pt endorses blurred vision in R eye that has worsened since increased pain in R side of face and head. Pt endorses a tingling sensation that is intermittent in her face.

## 2017-02-14 ENCOUNTER — Ambulatory Visit (HOSPITAL_COMMUNITY)
Admission: EM | Admit: 2017-02-14 | Discharge: 2017-02-14 | Disposition: A | Discharge status: Home / Self Care | Payer: Self-pay | Attending: Family Medicine | Admitting: Family Medicine

## 2017-02-14 ENCOUNTER — Encounter (HOSPITAL_COMMUNITY): Payer: Self-pay | Admitting: Emergency Medicine

## 2017-02-14 DIAGNOSIS — R3911 Hesitancy of micturition: Secondary | ICD-10-CM | POA: Insufficient documentation

## 2017-02-14 DIAGNOSIS — Z79891 Long term (current) use of opiate analgesic: Secondary | ICD-10-CM | POA: Insufficient documentation

## 2017-02-14 DIAGNOSIS — K5900 Constipation, unspecified: Secondary | ICD-10-CM | POA: Insufficient documentation

## 2017-02-14 DIAGNOSIS — Z880 Allergy status to penicillin: Secondary | ICD-10-CM | POA: Insufficient documentation

## 2017-02-14 DIAGNOSIS — Z79899 Other long term (current) drug therapy: Secondary | ICD-10-CM | POA: Insufficient documentation

## 2017-02-14 DIAGNOSIS — R350 Frequency of micturition: Secondary | ICD-10-CM | POA: Insufficient documentation

## 2017-02-14 DIAGNOSIS — M545 Low back pain, unspecified: Secondary | ICD-10-CM

## 2017-02-14 LAB — POCT URINALYSIS DIP (DEVICE)
BILIRUBIN URINE: NEGATIVE
GLUCOSE, UA: NEGATIVE mg/dL
Hgb urine dipstick: NEGATIVE
KETONES UR: NEGATIVE mg/dL
Leukocytes, UA: NEGATIVE
Nitrite: NEGATIVE
PROTEIN: NEGATIVE mg/dL
SPECIFIC GRAVITY, URINE: 1.015 (ref 1.005–1.030)
Urobilinogen, UA: 1 mg/dL (ref 0.0–1.0)
pH: 7 (ref 5.0–8.0)

## 2017-02-14 MED ORDER — DOCUSATE SODIUM 50 MG PO CAPS: 50.0000 mg | ORAL_CAPSULE | Freq: Two times a day (BID) | ORAL | 0 refills | Status: DC

## 2017-02-14 MED ORDER — POLYETHYLENE GLYCOL 3350 17 G PO PACK: 17.0000 g | PACK | Freq: Every day | ORAL | 0 refills | Status: DC

## 2017-02-14 NOTE — ED Triage Notes (Signed)
History of uti.  Reports symptoms for a few months.  Pain in lower back, no abdominal pain, denies pain or burning with urination.  Patient reports she did not have these symptoms in the past either when diagnosed with uti

## 2017-02-14 NOTE — ED Provider Notes (Signed)
CSN: [id number]     Arrival date & time 02/14/17  1234 History   None    Chief Complaint  Patient presents with  . Urinary Tract Infection   (Consider location/radiation/quality/duration/timing/severity/associated sxs/prior Treatment) 36 year old female comes in for a few months history of low back pain. She has also had urinary frequency, hesitancy, incomplete voiding. Denies dysuria, hematuria. Has had some chills. Denies abdominal pain, but states she feels suprapubic pressure. Has experienced some nausea, no vomiting. Denies vaginal discharge, itching/pain, spotting. Patient with history of tubal ligation. States low back pain started out on the right side, is not bilateral, and can alternate back and forth. Denies any injury. States that symptoms she is having is consistent with passed UTI symptoms. She also has history of constipation, alternating with diarrhea. She has had straining and hard bowel movements recently, last bowel movement yesterday.  Denies fever, night sweats. Denies weakness, dizziness. Denies history of kidney stones. Patient with history of vaginal births, admits to stress incontinence with coughing.       Past Medical History:  Diagnosis Date  . Medical history non-contributory    Past Surgical History:  Procedure Laterality Date  . CARPAL TUNNEL RELEASE    . CARPAL TUNNEL RELEASE    . TUBAL LIGATION     No family history on file. Social History  Substance Use Topics  . Smoking status: Former Games developermoker  . Smokeless tobacco: Never Used  . Alcohol use No   OB History    Gravida Para Term Preterm AB Living   4 3 3   1 3    SAB TAB Ectopic Multiple Live Births     1           Review of Systems  Reason unable to perform ROS: See HPI as above.    Allergies  Penicillins  Home Medications   Prior to Admission medications   Medication Sig Start Date End Date Taking? Authorizing Provider  acetaminophen (TYLENOL) 500 MG tablet Take 1,000 mg by mouth every  6 (six) hours as needed for pain.    [provider]  cyclobenzaprine (FLEXERIL) 10 MG tablet Take 1 tablet (10 mg total) by mouth 2 (two) times daily as needed for muscle spasms. 04/10/14   Marny LowensteinWenzel, Julie N, PA-C  docusate sodium (COLACE) 50 MG capsule Take 1 capsule (50 mg total) by mouth 2 (two) times daily. 02/14/17   Cathie HoopsYu, Arloa Prak V, PA-C  ibuprofen (ADVIL,MOTRIN) 200 MG tablet Take 800 mg by mouth every 6 (six) hours as needed for moderate pain.    [provider]  ibuprofen (ADVIL,MOTRIN) 600 MG tablet Take 1 tablet (600 mg total) by mouth every 6 (six) hours as needed. Patient not taking: Reported on 05/08/2015 04/10/14   Marny LowensteinWenzel, Julie N, PA-C  ondansetron (ZOFRAN) 8 MG tablet Take 1 tablet (8 mg total) by mouth every 8 (eight) hours as needed for nausea or vomiting. Patient not taking: Reported on 02/14/2017 12/08/15   Street, [place] Ranch[place], [place]-C  oxyCODONE-acetaminophen (PERCOCET/ROXICET) 5-325 MG per tablet Take 1 tablet by mouth every 8 (eight) hours as needed for severe pain. Patient not taking: Reported on 05/08/2015 02/27/14   Zadie RhineWickline, Donald, MD  oxyCODONE-acetaminophen (ROXICET) 5-325 MG per tablet Take 1 tablet by mouth every 4 (four) hours as needed for severe pain. Patient not taking: Reported on 05/08/2015 12/05/14   Darci CurrentBrown, [place] N, MD  polyethylene glycol [facility](MIRALAX) packet Take 17 g by mouth daily. 02/14/17   Belinda FisherYu, Braden Deloach V, PA-C   Meds Ordered  and Administered this Visit  Medications - No data to display  BP (!) 108/57 (BP Location: Right Arm)   Pulse 73   Temp 98.5 F (36.9 C) (Oral)   Resp 18   LMP 01/24/2017   SpO2 100%  No data found.   Physical Exam  Constitutional: She is oriented to person, place, and time. She appears well-developed and well-nourished. No distress.  HENT:  Head: Normocephalic and atraumatic.  Eyes: Pupils are equal, round, and reactive to light. Conjunctivae are normal.  Cardiovascular: Normal rate, regular rhythm and normal heart sounds.   Exam reveals no gallop and no friction rub.   No murmur heard. Pulmonary/Chest: Effort normal and breath sounds normal. She has no wheezes. She has no rales.  Abdominal: Soft. Bowel sounds are normal. She exhibits no mass. There is tenderness (Tenderness on palpation of the LUQ, LLQ, RLQ). There is no rebound, no guarding and no CVA tenderness.  Musculoskeletal:  No pain on palpation midline or bilateral back. Full range of motion.  Neurological: She is alert and oriented to person, place, and time.  Skin: Skin is warm and dry.  Psychiatric: She has a normal mood and affect. Her behavior is normal. Judgment normal.    Urgent Care Course     Procedures (including critical care time)  Labs Review Labs Reviewed  URINE CULTURE  POCT URINALYSIS DIP (DEVICE)    Imaging Review No results found.        MDM   1. Acute bilateral low back pain without sciatica   2. Urinary hesitancy   3. Constipation, unspecified constipation type    Discussed lab results with patient, urine dipstick negative for UTI, blood. Culture sent, patient will be contacted with any positive results that require further treatment. Given patient's history of constipation, with tenderness on palpation of the abdomen, could be cause of her symptoms, start Colace and MiraLAX. Given patient with history of vaginal birth, stress incontinence, discussed with patient symptoms could be due to anatomy change, follow up with GYN for further evaluation. Patient to monitor for worsening of symptoms, fever, vomiting, inability to void, to follow up at the ED for further evaluation.   Belinda FisherYu, Kiondra Caicedo V, PA-C 02/14/17 1344

## 2017-02-14 NOTE — Discharge Instructions (Signed)
Your urine dipstick was negative today. Culture sent, and you will be contacted with any positive results that require further treatment. Given your symptoms, follow up with GYN for further evaluation. Monitor for worsening of symptoms, vomiting, fever, inability to void, follow up at the ED for further evaluation.

## 2017-02-15 LAB — URINE CULTURE

## 2018-08-14 ENCOUNTER — Emergency Department (HOSPITAL_COMMUNITY)
Admission: EM | Admit: 2018-08-14 | Discharge: 2018-08-14 | Disposition: A | Discharge status: Home / Self Care | Payer: BLUE CROSS/BLUE SHIELD | Attending: Emergency Medicine | Admitting: Emergency Medicine

## 2018-08-14 ENCOUNTER — Other Ambulatory Visit: Payer: Self-pay

## 2018-08-14 ENCOUNTER — Encounter (HOSPITAL_COMMUNITY): Payer: Self-pay | Admitting: Emergency Medicine

## 2018-08-14 DIAGNOSIS — Z79899 Other long term (current) drug therapy: Secondary | ICD-10-CM | POA: Insufficient documentation

## 2018-08-14 DIAGNOSIS — R531 Weakness: Secondary | ICD-10-CM | POA: Insufficient documentation

## 2018-08-14 DIAGNOSIS — Z87891 Personal history of nicotine dependence: Secondary | ICD-10-CM | POA: Insufficient documentation

## 2018-08-14 LAB — CBC
HCT: 47.4 % — ABNORMAL HIGH (ref 36.0–46.0)
Hemoglobin: 14.7 g/dL (ref 12.0–15.0)
MCH: 27.6 pg (ref 26.0–34.0)
MCHC: 31 g/dL (ref 30.0–36.0)
MCV: 88.9 fL (ref 80.0–100.0)
NRBC: 0 % (ref 0.0–0.2)
PLATELETS: 331 10*3/uL (ref 150–400)
RBC: 5.33 MIL/uL — ABNORMAL HIGH (ref 3.87–5.11)
RDW: 12.6 % (ref 11.5–15.5)
WBC: 7.8 10*3/uL (ref 4.0–10.5)

## 2018-08-14 LAB — URINALYSIS, ROUTINE W REFLEX MICROSCOPIC
BILIRUBIN URINE: NEGATIVE
GLUCOSE, UA: NEGATIVE mg/dL
KETONES UR: NEGATIVE mg/dL
LEUKOCYTES UA: NEGATIVE
NITRITE: NEGATIVE
PH: 8 (ref 5.0–8.0)
Protein, ur: NEGATIVE mg/dL
SPECIFIC GRAVITY, URINE: 1.014 (ref 1.005–1.030)

## 2018-08-14 LAB — BASIC METABOLIC PANEL
Anion gap: 9 (ref 5–15)
BUN: 10 mg/dL (ref 6–20)
CALCIUM: 9.9 mg/dL (ref 8.9–10.3)
CO2: 23 mmol/L (ref 22–32)
CREATININE: 0.72 mg/dL (ref 0.44–1.00)
Chloride: 111 mmol/L (ref 98–111)
Glucose, Bld: 83 mg/dL (ref 70–99)
Potassium: 3.1 mmol/L — ABNORMAL LOW (ref 3.5–5.1)
Sodium: 143 mmol/L (ref 135–145)

## 2018-08-14 LAB — I-STAT BETA HCG BLOOD, ED (MC, WL, AP ONLY): I-stat hCG, quantitative: <5 m[IU]/mL (ref <5)

## 2018-08-14 MED ORDER — SODIUM CHLORIDE 0.9% FLUSH: 3.0000 mL | Freq: Once | INTRAVENOUS | Status: DC

## 2018-08-14 MED ORDER — MECLIZINE HCL 25 MG PO TABS
25.0000 mg | ORAL_TABLET | Freq: Once | ORAL | Status: AC
  Administered 2018-08-14: 25 mg via ORAL
  Filled 2018-08-14: qty 1

## 2018-08-14 NOTE — ED Provider Notes (Signed)
[facility] EMERGENCY DEPARTMENT Provider Note   CSN: [id number] Arrival date & time: 08/14/18  1046     History   Chief Complaint Chief Complaint  Patient presents with  . Dizziness    HPI Kelly Haley is a 38 y.o. female.  HPI   38 year old female presents today with complaints of lightheadedness.  Patient notes that yesterday while sitting on the couch she felt lightheaded.  She felt the sensation that she may pass out with some dizziness.  She notes she had some intermittent swelling and tightness in her throat at the same time.  She notes her heart was racing.  She notes symptoms worse with trying to stand up.  She notes the sensation of feeling cold as well, she denies any fevers.  She denies any associated shortness of breath or chest pain.  She notes after this event she felt very weak afterwards and has continued to feel this way.  She notes she still feels lightheaded as if she is going to pass out when standing.  She notes yesterday the sensation of spinning but none today.  She notes she has been eating and drinking appropriately, denies any bleeding abnormal bruising or any respiratory complaints.  She denies any infectious symptoms.  She denies any history of the same. Pt denies headache.    Past Medical History:  Diagnosis Date  . Medical history non-contributory     There are no active problems to display for this patient.   Past Surgical History:  Procedure Laterality Date  . CARPAL TUNNEL RELEASE    . CARPAL TUNNEL RELEASE    . TUBAL LIGATION       OB History    Gravida  4   Para  3   Term  3   Preterm      AB  1   Living  3     SAB      TAB  1   Ectopic      Multiple      Live Births              Home Medications    Prior to Admission medications   Medication Sig Start Date End Date Taking? Authorizing Provider  acetaminophen (TYLENOL) 500 MG tablet Take 1,000 mg by mouth every 6 (six) hours as needed  for pain.    [provider]  cyclobenzaprine (FLEXERIL) 10 MG tablet Take 1 tablet (10 mg total) by mouth 2 (two) times daily as needed for muscle spasms. 04/10/14   Marny Lowenstein, PA-C  docusate sodium (COLACE) 50 MG capsule Take 1 capsule (50 mg total) by mouth 2 (two) times daily. 02/14/17   Cathie Hoops, Amy V, PA-C  ibuprofen (ADVIL,MOTRIN) 200 MG tablet Take 800 mg by mouth every 6 (six) hours as needed for moderate pain.    [provider]  ibuprofen (ADVIL,MOTRIN) 600 MG tablet Take 1 tablet (600 mg total) by mouth every 6 (six) hours as needed. Patient not taking: Reported on 05/08/2015 04/10/14   Marny Lowenstein, PA-C  ondansetron (ZOFRAN) 8 MG tablet Take 1 tablet (8 mg total) by mouth every 8 (eight) hours as needed for nausea or vomiting. Patient not taking: Reported on 02/14/2017 12/08/15   Street, [place], [place]  oxyCODONE-acetaminophen (PERCOCET/ROXICET) 5-325 MG per tablet Take 1 tablet by mouth every 8 (eight) hours as needed for severe pain. Patient not taking: Reported on 05/08/2015 02/27/14   Zadie Rhine, MD  oxyCODONE-acetaminophen (ROXICET) 5-325 MG  per tablet Take 1 tablet by mouth every 4 (four) hours as needed for severe pain. Patient not taking: Reported on 05/08/2015 12/05/14   Darci CurrentBrown, Leslie N, MD  polyethylene glycol [facility](MIRALAX) packet Take 17 g by mouth daily. 02/14/17   Belinda FisherYu, Amy V, PA-C    Family History No family history on file.  Social History Social History   Tobacco Use  . Smoking status: Former Games developermoker  . Smokeless tobacco: Never Used  Substance Use Topics  . Alcohol use: No  . Drug use: No     Allergies   Penicillins   Review of Systems Review of Systems  All other systems reviewed and are negative.   Physical Exam Updated Vital Signs BP 119/63 (BP Location: Right Arm)   Pulse 93   Temp 98.4 F (36.9 C) (Oral)   Resp 18   Ht 5\' 4"  (1.626 m)   Wt 95.7 kg   LMP 08/10/2018   SpO2 100%   BMI 36.22 kg/m   Physical  Exam Vitals signs and nursing note reviewed.  Constitutional:      Appearance: She is well-developed.  HENT:     Head: Normocephalic and atraumatic.     Comments: Oropharynx clear with no erythema or edema Eyes:     General: No scleral icterus.       Right eye: No discharge.        Left eye: No discharge.     Conjunctiva/sclera: Conjunctivae normal.     Pupils: Pupils are equal, round, and reactive to light.  Neck:     Musculoskeletal: Normal range of motion.     Vascular: No JVD.     Trachea: No tracheal deviation.  Cardiovascular:     Rate and Rhythm: Normal rate and regular rhythm.     Heart sounds: Normal heart sounds. No murmur. No friction rub. No gallop.   Pulmonary:     Effort: Pulmonary effort is normal. No respiratory distress.     Breath sounds: No stridor. No wheezing.  Neurological:     Mental Status: She is alert and oriented to person, place, and time.     GCS: GCS eye subscore is 4. GCS verbal subscore is 5. GCS motor subscore is 6.     Cranial Nerves: Cranial nerves are intact. No cranial nerve deficit, dysarthria or facial asymmetry.     Sensory: Sensation is intact. No sensory deficit.     Motor: Motor function is intact. No weakness.     Coordination: Coordination is intact. Coordination normal.  Psychiatric:        Behavior: Behavior normal.        Thought Content: Thought content normal.        Judgment: Judgment normal.    ED Treatments / Results  Labs (all labs ordered are listed, but only abnormal results are displayed) Labs Reviewed  BASIC METABOLIC PANEL - Abnormal; Notable for the following components:      Result Value   Potassium 3.1 (*)    All other components within normal limits  CBC - Abnormal; Notable for the following components:   RBC 5.33 (*)    HCT 47.4 (*)    All other components within normal limits  URINALYSIS, ROUTINE W REFLEX MICROSCOPIC - Abnormal; Notable for the following components:   Hgb urine dipstick LARGE (*)     Bacteria, UA RARE (*)    All other components within normal limits  I-STAT BETA HCG BLOOD, ED ([facility], [facility], [facility] ONLY)  EKG EKG Interpretation  Date/Time:  Monday August 14 2018 10:55:29 EST Ventricular Rate:  98 PR Interval:  126 QRS Duration: 86 QT Interval:  322 QTC Calculation: 411 R Axis:   66 Text Interpretation:  Normal sinus rhythm Abnormal ECG downsloping st segments in inferior and lateral leads seen on prior No significant change since last tracing Confirmed by Melene PlanFloyd, Dan [id number]) on 08/14/2018 1:54:04 PM   Radiology No results found.  Procedures Procedures (including critical care time)  Medications Ordered in ED Medications  sodium chloride flush (NS) 0.9 % injection 3 mL (has no administration in time range)  meclizine (ANTIVERT) tablet 25 mg (25 mg Oral Given 08/14/18 1258)     Initial Impression / Assessment and Plan / ED Course  I have reviewed the triage vital signs and the nursing notes.  Pertinent labs & imaging results that were available during my care of the patient were reviewed by me and considered in my medical decision making (see chart for details).     Labs: I stat beta HCg, BMP, CBC  Imaging:   Consults:  Therapeutics: Meclizine  Discharge Meds:   Assessment/Plan: [age] YOF presents today with complaints of lightheadedness.  Patient feels generally weak.  She has no focal neurological deficits.  She has some vague description of dizziness yesterday.  She has no signs of infectious etiology, no cardiac or pulmonary complaints.  Patient's work-up notable for hypokalemia at 3.1.  She has no signs of acute intracranial abnormality.  Patient was given meclizine here, she was monitored.  She notes improvement in her symptoms, and she is ambulated she has no dizziness or significant lightheadedness at that time.  Patient will be discharged with outpatient follow-up and strict return precautions.  She verbalized understanding and agreement to today's plan  and had no further questions or concerns at the time of discharge.   Final Clinical Impressions(s) / ED Diagnoses   Final diagnoses:  Weakness    ED Discharge Orders    None       Rosalio LoudHedges, Mykal Kirchman, PA-C 08/14/18 1418    Melene PlanFloyd, Dan, DO 08/14/18 1447

## 2018-08-14 NOTE — Discharge Instructions (Signed)
Please read attached information. If you experience any new or worsening signs or symptoms please return to the emergency room for evaluation. Please follow-up with your primary care provider or specialist as discussed.  °

## 2018-08-14 NOTE — ED Triage Notes (Signed)
Onset one day ago developed dizziness EMS evaluated patient at home last night. Patient did not go to the hospital and symptoms continued today. Went to work and then decided to come to the ED for evaluation. Alert answering and following commands appropriate.

## 2018-09-05 ENCOUNTER — Observation Stay (HOSPITAL_COMMUNITY)
Admission: EM | Admit: 2018-09-05 | Discharge: 2018-09-07 | Disposition: A | Discharge status: Home / Self Care | Payer: BLUE CROSS/BLUE SHIELD | Attending: Emergency Medicine | Admitting: Emergency Medicine

## 2018-09-05 ENCOUNTER — Other Ambulatory Visit: Payer: Self-pay

## 2018-09-05 DIAGNOSIS — K3580 Unspecified acute appendicitis: Secondary | ICD-10-CM

## 2018-09-05 DIAGNOSIS — Z880 Allergy status to penicillin: Secondary | ICD-10-CM | POA: Diagnosis not present

## 2018-09-05 DIAGNOSIS — K3589 Other acute appendicitis without perforation or gangrene: Secondary | ICD-10-CM | POA: Diagnosis not present

## 2018-09-05 DIAGNOSIS — K37 Unspecified appendicitis: Secondary | ICD-10-CM | POA: Diagnosis present

## 2018-09-05 DIAGNOSIS — Z6836 Body mass index (BMI) 36.0-36.9, adult: Secondary | ICD-10-CM | POA: Insufficient documentation

## 2018-09-05 DIAGNOSIS — Z791 Long term (current) use of non-steroidal anti-inflammatories (NSAID): Secondary | ICD-10-CM | POA: Diagnosis not present

## 2018-09-05 DIAGNOSIS — Z87891 Personal history of nicotine dependence: Secondary | ICD-10-CM | POA: Diagnosis not present

## 2018-09-05 DIAGNOSIS — R109 Unspecified abdominal pain: Secondary | ICD-10-CM | POA: Diagnosis present

## 2018-09-05 LAB — URINALYSIS, ROUTINE W REFLEX MICROSCOPIC
Bacteria, UA: NONE SEEN
Bilirubin Urine: NEGATIVE
Glucose, UA: NEGATIVE mg/dL
Ketones, ur: NEGATIVE mg/dL
Leukocytes,Ua: NEGATIVE
Nitrite: NEGATIVE
PH: 7 (ref 5.0–8.0)
Protein, ur: NEGATIVE mg/dL
Specific Gravity, Urine: 1.021 (ref 1.005–1.030)

## 2018-09-05 LAB — CBC
HCT: 42.7 % (ref 36.0–46.0)
Hemoglobin: 13.9 g/dL (ref 12.0–15.0)
MCH: 28.4 pg (ref 26.0–34.0)
MCHC: 32.6 g/dL (ref 30.0–36.0)
MCV: 87.1 fL (ref 80.0–100.0)
PLATELETS: 287 10*3/uL (ref 150–400)
RBC: 4.9 MIL/uL (ref 3.87–5.11)
RDW: 12.3 % (ref 11.5–15.5)
WBC: 12 10*3/uL — ABNORMAL HIGH (ref 4.0–10.5)
nRBC: 0 % (ref 0.0–0.2)

## 2018-09-05 LAB — LIPASE, BLOOD: Lipase: 39 U/L (ref 11–51)

## 2018-09-05 LAB — COMPREHENSIVE METABOLIC PANEL
ALBUMIN: 3.8 g/dL (ref 3.5–5.0)
ALT: 14 U/L (ref 0–44)
ANION GAP: 8 (ref 5–15)
AST: 18 U/L (ref 15–41)
Alkaline Phosphatase: 54 U/L (ref 38–126)
BUN: 16 mg/dL (ref 6–20)
CO2: 24 mmol/L (ref 22–32)
Calcium: 9.3 mg/dL (ref 8.9–10.3)
Chloride: 106 mmol/L (ref 98–111)
Creatinine, Ser: 0.72 mg/dL (ref 0.44–1.00)
GFR calc Af Amer: >60 mL/min (ref >60)
GFR calc non Af Amer: >60 mL/min (ref >60)
GLUCOSE: 112 mg/dL — AB (ref 70–99)
POTASSIUM: 3.5 mmol/L (ref 3.5–5.1)
Sodium: 138 mmol/L (ref 135–145)
Total Bilirubin: 0.7 mg/dL (ref 0.3–1.2)
Total Protein: 7 g/dL (ref 6.5–8.1)

## 2018-09-05 MED ORDER — SODIUM CHLORIDE 0.9% FLUSH
3.0000 mL | Freq: Once | INTRAVENOUS | Status: AC
  Administered 2018-09-06: 3 mL via INTRAVENOUS

## 2018-09-05 NOTE — ED Triage Notes (Signed)
Pt here for lower right sided abd pain that began at 1500 on 2/18.  A&Ox4.  N/V with 2 episodes of emesis.  No new foods or allergies.

## 2018-09-06 ENCOUNTER — Encounter (HOSPITAL_COMMUNITY): Payer: Self-pay | Admitting: *Deleted

## 2018-09-06 ENCOUNTER — Observation Stay (HOSPITAL_COMMUNITY): Payer: BLUE CROSS/BLUE SHIELD | Admitting: Anesthesiology

## 2018-09-06 ENCOUNTER — Emergency Department (HOSPITAL_COMMUNITY): Payer: BLUE CROSS/BLUE SHIELD

## 2018-09-06 ENCOUNTER — Encounter (HOSPITAL_COMMUNITY)
Admission: EM | Disposition: A | Discharge status: Home / Self Care | Payer: Self-pay | Source: Home / Self Care | Attending: Emergency Medicine

## 2018-09-06 DIAGNOSIS — K37 Unspecified appendicitis: Secondary | ICD-10-CM | POA: Diagnosis present

## 2018-09-06 HISTORY — PX: APPENDECTOMY: SHX54

## 2018-09-06 HISTORY — PX: LAPAROSCOPIC APPENDECTOMY: SHX408

## 2018-09-06 LAB — CREATININE, SERUM
Creatinine, Ser: 0.63 mg/dL (ref 0.44–1.00)
GFR calc Af Amer: >60 mL/min (ref >60)
GFR calc non Af Amer: >60 mL/min (ref >60)

## 2018-09-06 LAB — CBC
HCT: 43.9 % (ref 36.0–46.0)
Hemoglobin: 13.9 g/dL (ref 12.0–15.0)
MCH: 27.8 pg (ref 26.0–34.0)
MCHC: 31.7 g/dL (ref 30.0–36.0)
MCV: 87.8 fL (ref 80.0–100.0)
Platelets: 298 10*3/uL (ref 150–400)
RBC: 5 MIL/uL (ref 3.87–5.11)
RDW: 12.5 % (ref 11.5–15.5)
WBC: 11.9 10*3/uL — AB (ref 4.0–10.5)
nRBC: 0 % (ref 0.0–0.2)

## 2018-09-06 LAB — PREGNANCY, URINE: PREG TEST UR: NEGATIVE

## 2018-09-06 SURGERY — APPENDECTOMY, LAPAROSCOPIC
Anesthesia: General | Site: Abdomen

## 2018-09-06 MED ORDER — METRONIDAZOLE IN NACL 5-0.79 MG/ML-% IV SOLN
500.0000 mg | Freq: Three times a day (TID) | INTRAVENOUS | Status: DC
  Administered 2018-09-06 – 2018-09-07 (×3): 500 mg via INTRAVENOUS
  Filled 2018-09-06 (×3): qty 100

## 2018-09-06 MED ORDER — BUPIVACAINE HCL 0.25 % IJ SOLN
INTRAMUSCULAR | Status: DC | PRN
  Administered 2018-09-06: 7 mL

## 2018-09-06 MED ORDER — CIPROFLOXACIN IN D5W 400 MG/200ML IV SOLN
400.0000 mg | Freq: Once | INTRAVENOUS | Status: DC
  Filled 2018-09-06: qty 200

## 2018-09-06 MED ORDER — SUCCINYLCHOLINE CHLORIDE 20 MG/ML IJ SOLN
INTRAMUSCULAR | Status: DC | PRN
  Administered 2018-09-06: 120 mg via INTRAVENOUS

## 2018-09-06 MED ORDER — PROPOFOL 10 MG/ML IV BOLUS
INTRAVENOUS | Status: DC | PRN
  Administered 2018-09-06: 150 mg via INTRAVENOUS

## 2018-09-06 MED ORDER — LACTATED RINGERS IV SOLN
INTRAVENOUS | Status: DC | PRN
  Administered 2018-09-06 (×2): via INTRAVENOUS

## 2018-09-06 MED ORDER — DIPHENHYDRAMINE HCL 25 MG PO CAPS: 25.0000 mg | ORAL_CAPSULE | Freq: Four times a day (QID) | ORAL | Status: DC | PRN

## 2018-09-06 MED ORDER — IOHEXOL 300 MG/ML  SOLN
100.0000 mL | Freq: Once | INTRAMUSCULAR | Status: AC | PRN
  Administered 2018-09-06: 100 mL via INTRAVENOUS

## 2018-09-06 MED ORDER — DEXAMETHASONE SODIUM PHOSPHATE 10 MG/ML IJ SOLN
INTRAMUSCULAR | Status: DC | PRN
  Administered 2018-09-06: 10 mg via INTRAVENOUS

## 2018-09-06 MED ORDER — ONDANSETRON 4 MG PO TBDP: 4.0000 mg | ORAL_TABLET | Freq: Four times a day (QID) | ORAL | Status: DC | PRN

## 2018-09-06 MED ORDER — LIDOCAINE 2% (20 MG/ML) 5 ML SYRINGE
INTRAMUSCULAR | Status: AC
  Filled 2018-09-06: qty 5

## 2018-09-06 MED ORDER — SODIUM CHLORIDE 0.9 % IR SOLN
Status: DC | PRN
  Administered 2018-09-06: 1000 mL

## 2018-09-06 MED ORDER — KETOROLAC TROMETHAMINE 30 MG/ML IJ SOLN
INTRAMUSCULAR | Status: AC
  Filled 2018-09-06: qty 1

## 2018-09-06 MED ORDER — HYDROMORPHONE HCL 1 MG/ML IJ SOLN
INTRAMUSCULAR | Status: AC
  Filled 2018-09-06: qty 1

## 2018-09-06 MED ORDER — KETOROLAC TROMETHAMINE 30 MG/ML IJ SOLN
INTRAMUSCULAR | Status: DC | PRN
  Administered 2018-09-06 (×2): 15 mg via INTRAVENOUS

## 2018-09-06 MED ORDER — ACETAMINOPHEN 325 MG PO TABS: 650.0000 mg | ORAL_TABLET | Freq: Four times a day (QID) | ORAL | Status: DC | PRN

## 2018-09-06 MED ORDER — HYDROMORPHONE HCL 1 MG/ML IJ SOLN
0.2500 mg | INTRAMUSCULAR | Status: DC | PRN
  Administered 2018-09-06: 0.25 mg via INTRAVENOUS

## 2018-09-06 MED ORDER — LIDOCAINE 2% (20 MG/ML) 5 ML SYRINGE
INTRAMUSCULAR | Status: DC | PRN
  Administered 2018-09-06: 60 mg via INTRAVENOUS

## 2018-09-06 MED ORDER — MORPHINE SULFATE (PF) 2 MG/ML IV SOLN: 2.0000 mg | INTRAVENOUS | Status: DC | PRN

## 2018-09-06 MED ORDER — OXYCODONE HCL 5 MG PO TABS
5.0000 mg | ORAL_TABLET | ORAL | Status: DC | PRN
  Administered 2018-09-06 – 2018-09-07 (×2): 10 mg via ORAL
  Filled 2018-09-06 (×2): qty 2

## 2018-09-06 MED ORDER — FENTANYL CITRATE (PF) 250 MCG/5ML IJ SOLN
INTRAMUSCULAR | Status: AC
  Filled 2018-09-06: qty 5

## 2018-09-06 MED ORDER — FENTANYL CITRATE (PF) 100 MCG/2ML IJ SOLN
INTRAMUSCULAR | Status: DC | PRN
  Administered 2018-09-06: 100 ug via INTRAVENOUS
  Administered 2018-09-06 (×3): 50 ug via INTRAVENOUS
  Administered 2018-09-06: 100 ug via INTRAVENOUS

## 2018-09-06 MED ORDER — MIDAZOLAM HCL 5 MG/5ML IJ SOLN
INTRAMUSCULAR | Status: DC | PRN
  Administered 2018-09-06: 2 mg via INTRAVENOUS

## 2018-09-06 MED ORDER — MORPHINE SULFATE (PF) 4 MG/ML IV SOLN
4.0000 mg | Freq: Once | INTRAVENOUS | Status: AC
  Administered 2018-09-06: 4 mg via INTRAVENOUS
  Filled 2018-09-06: qty 1

## 2018-09-06 MED ORDER — 0.9 % SODIUM CHLORIDE (POUR BTL) OPTIME
TOPICAL | Status: DC | PRN
  Administered 2018-09-06: 1000 mL

## 2018-09-06 MED ORDER — PANTOPRAZOLE SODIUM 40 MG IV SOLR
40.0000 mg | Freq: Every day | INTRAVENOUS | Status: DC
  Administered 2018-09-06: 40 mg via INTRAVENOUS
  Filled 2018-09-06: qty 40

## 2018-09-06 MED ORDER — BUPIVACAINE HCL (PF) 0.25 % IJ SOLN
INTRAMUSCULAR | Status: AC
  Filled 2018-09-06: qty 10

## 2018-09-06 MED ORDER — DOCUSATE SODIUM 100 MG PO CAPS
100.0000 mg | ORAL_CAPSULE | Freq: Two times a day (BID) | ORAL | Status: DC
  Administered 2018-09-07: 100 mg via ORAL
  Filled 2018-09-06: qty 1

## 2018-09-06 MED ORDER — ROCURONIUM BROMIDE 10 MG/ML (PF) SYRINGE
PREFILLED_SYRINGE | INTRAVENOUS | Status: DC | PRN
  Administered 2018-09-06 (×2): 25 mg via INTRAVENOUS

## 2018-09-06 MED ORDER — DIPHENHYDRAMINE HCL 50 MG/ML IJ SOLN: 25.0000 mg | Freq: Four times a day (QID) | INTRAMUSCULAR | Status: DC | PRN

## 2018-09-06 MED ORDER — METRONIDAZOLE IN NACL 5-0.79 MG/ML-% IV SOLN
500.0000 mg | Freq: Once | INTRAVENOUS | Status: AC
  Administered 2018-09-06: 500 mg via INTRAVENOUS
  Filled 2018-09-06: qty 100

## 2018-09-06 MED ORDER — PROPOFOL 10 MG/ML IV BOLUS
INTRAVENOUS | Status: AC
  Filled 2018-09-06: qty 20

## 2018-09-06 MED ORDER — ACETAMINOPHEN 650 MG RE SUPP: 650.0000 mg | Freq: Four times a day (QID) | RECTAL | Status: DC | PRN

## 2018-09-06 MED ORDER — ONDANSETRON HCL 4 MG/2ML IJ SOLN: 4.0000 mg | Freq: Four times a day (QID) | INTRAMUSCULAR | Status: DC | PRN

## 2018-09-06 MED ORDER — MIDAZOLAM HCL 2 MG/2ML IJ SOLN
INTRAMUSCULAR | Status: AC
  Filled 2018-09-06: qty 2

## 2018-09-06 MED ORDER — HYDRALAZINE HCL 20 MG/ML IJ SOLN: 10.0000 mg | INTRAMUSCULAR | Status: DC | PRN

## 2018-09-06 MED ORDER — DEXAMETHASONE SODIUM PHOSPHATE 10 MG/ML IJ SOLN
INTRAMUSCULAR | Status: AC
  Filled 2018-09-06: qty 1

## 2018-09-06 MED ORDER — SUGAMMADEX SODIUM 200 MG/2ML IV SOLN
INTRAVENOUS | Status: DC | PRN
  Administered 2018-09-06: 200 mg via INTRAVENOUS

## 2018-09-06 MED ORDER — ONDANSETRON HCL 4 MG/2ML IJ SOLN
INTRAMUSCULAR | Status: AC
  Filled 2018-09-06: qty 2

## 2018-09-06 MED ORDER — ENOXAPARIN SODIUM 40 MG/0.4ML ~~LOC~~ SOLN
40.0000 mg | SUBCUTANEOUS | Status: DC
  Administered 2018-09-07: 40 mg via SUBCUTANEOUS
  Filled 2018-09-06: qty 0.4

## 2018-09-06 MED ORDER — ONDANSETRON HCL 4 MG/2ML IJ SOLN
INTRAMUSCULAR | Status: DC | PRN
  Administered 2018-09-06: 4 mg via INTRAVENOUS

## 2018-09-06 MED ORDER — CIPROFLOXACIN IN D5W 400 MG/200ML IV SOLN
400.0000 mg | Freq: Two times a day (BID) | INTRAVENOUS | Status: DC
  Administered 2018-09-06 – 2018-09-07 (×3): 400 mg via INTRAVENOUS
  Filled 2018-09-06 (×3): qty 200

## 2018-09-06 SURGICAL SUPPLY — 45 items
APPLIER CLIP 5 13 M/L LIGAMAX5 (MISCELLANEOUS)
BENZOIN TINCTURE PRP APPL 2/3 (GAUZE/BANDAGES/DRESSINGS) ×3 IMPLANT
BLADE CLIPPER SURG (BLADE) IMPLANT
CANISTER SUCT 3000ML PPV (MISCELLANEOUS) ×3 IMPLANT
CHLORAPREP W/TINT 26ML (MISCELLANEOUS) ×3 IMPLANT
CLIP APPLIE 5 13 M/L LIGAMAX5 (MISCELLANEOUS) IMPLANT
CLOSURE WOUND 1/2 X4 (GAUZE/BANDAGES/DRESSINGS) ×1
COVER SURGICAL LIGHT HANDLE (MISCELLANEOUS) ×3 IMPLANT
COVER TRANSDUCER ULTRASND (DRAPES) ×3 IMPLANT
COVER WAND RF STERILE (DRAPES) ×3 IMPLANT
DERMABOND ADVANCED (GAUZE/BANDAGES/DRESSINGS) ×4
DERMABOND ADVANCED .7 DNX12 (GAUZE/BANDAGES/DRESSINGS) ×2 IMPLANT
DRSG TEGADERM 2-3/8X2-3/4 SM (GAUZE/BANDAGES/DRESSINGS) ×3 IMPLANT
ELECT REM PT RETURN 9FT ADLT (ELECTROSURGICAL) ×3
ELECTRODE REM PT RTRN 9FT ADLT (ELECTROSURGICAL) ×1 IMPLANT
ENDOLOOP SUT PDS II  0 18 (SUTURE) ×8
ENDOLOOP SUT PDS II 0 18 (SUTURE) ×4 IMPLANT
GAUZE SPONGE 2X2 8PLY STRL LF (GAUZE/BANDAGES/DRESSINGS) ×1 IMPLANT
GLOVE BIO SURGEON STRL SZ7.5 (GLOVE) ×3 IMPLANT
GOWN STRL REUS W/ TWL LRG LVL3 (GOWN DISPOSABLE) ×2 IMPLANT
GOWN STRL REUS W/ TWL XL LVL3 (GOWN DISPOSABLE) ×1 IMPLANT
GOWN STRL REUS W/TWL LRG LVL3 (GOWN DISPOSABLE) ×4
GOWN STRL REUS W/TWL XL LVL3 (GOWN DISPOSABLE) ×2
GRASPER SUT TROCAR 14GX15 (MISCELLANEOUS) ×3 IMPLANT
KIT BASIN OR (CUSTOM PROCEDURE TRAY) ×3 IMPLANT
KIT TURNOVER KIT B (KITS) ×3 IMPLANT
NEEDLE INSUFFLATION 14GA 120MM (NEEDLE) ×3 IMPLANT
NS IRRIG 1000ML POUR BTL (IV SOLUTION) ×3 IMPLANT
PAD ARMBOARD 7.5X6 YLW CONV (MISCELLANEOUS) ×6 IMPLANT
SCISSORS LAP 5X35 DISP (ENDOMECHANICALS) ×3 IMPLANT
SET IRRIG TUBING LAPAROSCOPIC (IRRIGATION / IRRIGATOR) ×3 IMPLANT
SET TUBE SMOKE EVAC HIGH FLOW (TUBING) ×3 IMPLANT
SLEEVE ENDOPATH XCEL 5M (ENDOMECHANICALS) ×3 IMPLANT
SPECIMEN JAR SMALL (MISCELLANEOUS) ×3 IMPLANT
SPONGE GAUZE 2X2 STER 10/PKG (GAUZE/BANDAGES/DRESSINGS) ×2
STRIP CLOSURE SKIN 1/2X4 (GAUZE/BANDAGES/DRESSINGS) ×2 IMPLANT
SUT MNCRL AB 4-0 PS2 18 (SUTURE) ×3 IMPLANT
SUT SILK 3 0 SH 30 (SUTURE) ×3 IMPLANT
TOWEL OR 17X24 6PK STRL BLUE (TOWEL DISPOSABLE) ×3 IMPLANT
TOWEL OR 17X26 10 PK STRL BLUE (TOWEL DISPOSABLE) ×3 IMPLANT
TRAY FOLEY W/BAG SLVR 14FR (SET/KITS/TRAYS/PACK) ×3 IMPLANT
TRAY LAPAROSCOPIC MC (CUSTOM PROCEDURE TRAY) ×3 IMPLANT
TROCAR XCEL NON-BLD 11X100MML (ENDOMECHANICALS) ×3 IMPLANT
TROCAR XCEL NON-BLD 5MMX100MML (ENDOMECHANICALS) ×3 IMPLANT
WATER STERILE IRR 1000ML POUR (IV SOLUTION) ×3 IMPLANT

## 2018-09-06 NOTE — Anesthesia Preprocedure Evaluation (Addendum)
Anesthesia Evaluation  Patient identified by MRN, date of birth, ID band Patient awake    Reviewed: Allergy & Precautions, H&P , NPO status , Patient's Chart, lab work & pertinent test results  Airway Mallampati: I  TM Distance: >3 FB Neck ROM: Full    Dental no notable dental hx. (+) Teeth Intact, Dental Advisory Given   Pulmonary neg pulmonary ROS, former smoker,    Pulmonary exam normal breath sounds clear to auscultation       Cardiovascular negative cardio ROS   Rhythm:Regular Rate:Normal     Neuro/Psych negative neurological ROS  negative psych ROS   GI/Hepatic negative GI ROS, Neg liver ROS,   Endo/Other  Morbid obesity  Renal/GU negative Renal ROS  negative genitourinary   Musculoskeletal   Abdominal   Peds  Hematology negative hematology ROS (+)   Anesthesia Other Findings   Reproductive/Obstetrics negative OB ROS                            Anesthesia Physical Anesthesia Plan  ASA: II  Anesthesia Plan: General   Post-op Pain Management:    Induction: Intravenous, Rapid sequence and Cricoid pressure planned  PONV Risk Score and Plan: 3 and Ondansetron, Dexamethasone and Midazolam  Airway Management Planned: Oral ETT  Additional Equipment:   Intra-op Plan:   Post-operative Plan: Extubation in OR  Informed Consent: I have reviewed the patients History and Physical, chart, labs and discussed the procedure including the risks, benefits and alternatives for the proposed anesthesia with the patient or authorized representative who has indicated his/her understanding and acceptance.     Dental advisory given  Plan Discussed with: CRNA  Anesthesia Plan Comments:         Anesthesia Quick Evaluation

## 2018-09-06 NOTE — Discharge Instructions (Signed)
Please arrive at least 20 min before your appointment to complete your check in paperwork.  If you are unable to arrive 30 min prior to your appointment time we may have to cancel or reschedule you. ° °LAPAROSCOPIC SURGERY: POST OP INSTRUCTIONS  °1. DIET: Follow a light bland diet the first 24 hours after arrival home, such as soup, liquids, crackers, etc. Be sure to include lots of fluids daily. Avoid fast food or heavy meals as your are more likely to get nauseated. Eat a low fat the next few days after surgery.  °2. Take your usually prescribed home medications unless otherwise directed. °3. PAIN CONTROL:  °1. Pain is best controlled by a usual combination of three different methods TOGETHER:  °1. Ice/Heat °2. Over the counter pain medication °3. Prescription pain medication °2. Most patients will experience some swelling and bruising around the incisions. Ice packs or heating pads (30-60 minutes up to 6 times a day) will help. Use ice for the first few days to help decrease swelling and bruising, then switch to heat to help relax tight/sore spots and speed recovery. Some people prefer to use ice alone, heat alone, alternating between ice & heat. Experiment to what works for you. Swelling and bruising can take several weeks to resolve.  °3. It is helpful to take an over-the-counter pain medication regularly for the first few weeks. Choose one of the following that works best for you:  °1. Naproxen (Aleve, etc) Two 220mg tabs twice a day °2. Ibuprofen (Advil, etc) Three 200mg tabs four times a day (every meal & bedtime) °3. Acetaminophen (Tylenol, etc) 500-650mg four times a day (every meal & bedtime) °4. A prescription for pain medication (such as oxycodone, hydrocodone, etc) should be given to you upon discharge. Take your pain medication as prescribed.  °1. If you are having problems/concerns with the prescription medicine (does not control pain, nausea, vomiting, rash, itching, etc), please call us [phone]  [phone] to see if we need to switch you to a different pain medicine that will work better for you and/or control your side effect better. °2. If you need a refill on your pain medication, please contact your pharmacy. They will contact our office to request authorization. Prescriptions will not be filled after 5 pm or on week-ends. °4. Avoid getting constipated. Between the surgery and the pain medications, it is common to experience some constipation. Increasing fluid intake and taking a fiber supplement (such as Metamucil, Citrucel, FiberCon, MiraLax, etc) 1-2 times a day regularly will usually help prevent this problem from occurring. A mild laxative (prune juice, Milk of Magnesia, MiraLax, etc) should be taken according to package directions if there are no bowel movements after 48 hours.  °5. Watch out for diarrhea. If you have many loose bowel movements, simplify your diet to bland foods & liquids for a few days. Stop any stool softeners and decrease your fiber supplement. Switching to mild anti-diarrheal medications (Kayopectate, Pepto Bismol) can help. If this worsens or does not improve, please call us. °6. Wash / shower every day. You may shower over the dressings as they are waterproof. Continue to shower over incision(s) after the dressing is off. °7. Remove your waterproof bandages 5 days after surgery. You may leave the incision open to air. You may replace a dressing/Band-Aid to cover the incision for comfort if you wish.  °8. ACTIVITIES as tolerated:  °1. You may resume regular (light) daily activities beginning the next day--such as daily self-care, walking, climbing stairs--gradually   increasing activities as tolerated. If you can walk 30 minutes without difficulty, it is safe to try more intense activity such as jogging, treadmill, bicycling, low-impact aerobics, swimming, etc. °2. Save the most intensive and strenuous activity for last such as sit-ups, heavy lifting, contact sports, etc Refrain  from any heavy lifting or straining until you are off narcotics for pain control.  °3. DO NOT PUSH THROUGH PAIN. Let pain be your guide: If it hurts to do something, don't do it. Pain is your body warning you to avoid that activity for another week until the pain goes down. °4. You may drive when you are no longer taking prescription pain medication, you can comfortably wear a seatbelt, and you can safely maneuver your car and apply brakes. °5. You may have sexual intercourse when it is comfortable.  °9. FOLLOW UP in our office  °1. Please call [facility] at [phone] to set up an appointment to see your surgeon in the office for a follow-up appointment approximately 2-3 weeks after your surgery. °2. Make sure that you call for this appointment the day you arrive home to insure a convenient appointment time. °     10. IF YOU HAVE DISABILITY OR FAMILY LEAVE FORMS, BRING THEM TO THE               OFFICE FOR PROCESSING.  ° °WHEN TO CALL US [phone]:  °1. Poor pain control °2. Reactions / problems with new medications (rash/itching, nausea, etc)  °3. Fever over 101.5 F (38.5 C) °4. Inability to urinate °5. Nausea and/or vomiting °6. Worsening swelling or bruising °7. Continued bleeding from incision. °8. Increased pain, redness, or drainage from the incision ° °The clinic staff is available to answer your questions during regular business hours (8:30am-5pm). Please don’t hesitate to call and ask to speak to one of our nurses for clinical concerns.  °If you have a medical emergency, go to the nearest emergency room or call 911.  °A surgeon from [facility] is always on call at the hospitals  ° °[facility]  °[address], [address], [place], [place] [address] ?  °MAIN: [phone] ? TOLL FREE: [phone] ?  °FAX [phone]  °[email or web address] ° °

## 2018-09-06 NOTE — ED Provider Notes (Signed)
[facility] EMERGENCY DEPARTMENT Provider Note   CSN: [id number] Arrival date & time: 09/05/18  2141    History   Chief Complaint Chief Complaint  Patient presents with  . Abdominal Pain    HPI Kelly Haley is a 38 y.o. female with no significant pmhx who presented to the ED today complaining of RLQ abdominal pain. Pt reports that she had acute onset of this pain yesterday afternoon that was sharp in nature. She laid on the floor for 30 minutes writhing in pain before she could get up. Endorses associated N/V. No reported fevers or chills. No prior hx of similar symptoms. She has had her tubes tied many years ago but no other hx of abdominal surgeries. Denies dysuria, vaginal discharge. LMP was 3 weeks ago. Sexually active with one female partner. No concern for STDs.      HPI  Past Medical History:  Diagnosis Date  . Medical history non-contributory     There are no active problems to display for this patient.   Past Surgical History:  Procedure Laterality Date  . CARPAL TUNNEL RELEASE    . CARPAL TUNNEL RELEASE    . TUBAL LIGATION       OB History    Gravida  4   Para  3   Term  3   Preterm      AB  1   Living  3     SAB      TAB  1   Ectopic      Multiple      Live Births               Home Medications    Prior to Admission medications   Medication Sig Start Date End Date Taking? Authorizing Provider  acetaminophen (TYLENOL) 500 MG tablet Take 1,000 mg by mouth every 6 (six) hours as needed for pain.    [provider]  cyclobenzaprine (FLEXERIL) 10 MG tablet Take 1 tablet (10 mg total) by mouth 2 (two) times daily as needed for muscle spasms. 04/10/14   Marny Lowenstein, PA-C  docusate sodium (COLACE) 50 MG capsule Take 1 capsule (50 mg total) by mouth 2 (two) times daily. 02/14/17   Cathie Hoops, Amy V, PA-C  ibuprofen (ADVIL,MOTRIN) 200 MG tablet Take 800 mg by mouth every 6 (six) hours as needed for moderate pain.     [provider]  ibuprofen (ADVIL,MOTRIN) 600 MG tablet Take 1 tablet (600 mg total) by mouth every 6 (six) hours as needed. Patient not taking: Reported on 05/08/2015 04/10/14   Marny Lowenstein, PA-C  ondansetron (ZOFRAN) 8 MG tablet Take 1 tablet (8 mg total) by mouth every 8 (eight) hours as needed for nausea or vomiting. Patient not taking: Reported on 02/14/2017 12/08/15   [address], [place], [place]  oxyCODONE-acetaminophen (PERCOCET/ROXICET) 5-325 MG per tablet Take 1 tablet by mouth every 8 (eight) hours as needed for severe pain. Patient not taking: Reported on 05/08/2015 02/27/14   Zadie Rhine, MD  oxyCODONE-acetaminophen (ROXICET) 5-325 MG per tablet Take 1 tablet by mouth every 4 (four) hours as needed for severe pain. Patient not taking: Reported on 05/08/2015 12/05/14   Darci Current, MD  polyethylene glycol [facility]) packet Take 17 g by mouth daily. 02/14/17   Belinda Fisher, PA-C    Family History No family history on file.  Social History Social History   Tobacco Use  . Smoking status: Former Games developer  . Smokeless tobacco: Never  Used  Substance Use Topics  . Alcohol use: No  . Drug use: No     Allergies   Penicillins   Review of Systems Review of Systems  All other systems reviewed and are negative.    Physical Exam Updated Vital Signs BP (!) 129/51   Pulse 83   Temp 98 F (36.7 C) (Oral)   Resp 18   Ht 5\' 4"  (1.626 m)   Wt 95.7 kg   LMP 08/10/2018   SpO2 100%   BMI 36.22 kg/m   Physical Exam Vitals signs and nursing note reviewed.  Constitutional:      General: She is not in acute distress.    Appearance: She is well-developed. She is not diaphoretic.  HENT:     Head: Normocephalic and atraumatic.     Mouth/Throat:     Pharynx: No oropharyngeal exudate.  Eyes:     General: No scleral icterus.       Right eye: No discharge.        Left eye: No discharge.     Conjunctiva/sclera: Conjunctivae normal.     Pupils: Pupils are equal, round,  and reactive to light.  Cardiovascular:     Rate and Rhythm: Normal rate and regular rhythm.     Heart sounds: Normal heart sounds. No murmur. No friction rub. No gallop.   Pulmonary:     Effort: Pulmonary effort is normal. No respiratory distress.     Breath sounds: Normal breath sounds. No wheezing or rales.  Chest:     Chest wall: No tenderness.  Abdominal:     General: Bowel sounds are normal. There is no distension.     Palpations: Abdomen is soft.     Tenderness: There is abdominal tenderness in the right lower quadrant. There is no guarding. Positive signs include McBurney's sign. Negative signs include Murphy's sign and obturator sign.  Musculoskeletal: Normal range of motion.  Skin:    General: Skin is warm and dry.     Coloration: Skin is not pale.     Findings: No erythema or rash.  Neurological:     Mental Status: She is alert and oriented to person, place, and time.  Psychiatric:        Behavior: Behavior normal.      ED Treatments / Results  Labs (all labs ordered are listed, but only abnormal results are displayed) Labs Reviewed  COMPREHENSIVE METABOLIC PANEL - Abnormal; Notable for the following components:      Result Value   Glucose, Bld 112 (*)    All other components within normal limits  CBC - Abnormal; Notable for the following components:   WBC 12.0 (*)    All other components within normal limits  URINALYSIS, ROUTINE W REFLEX MICROSCOPIC - Abnormal; Notable for the following components:   Hgb urine dipstick SMALL (*)    All other components within normal limits  LIPASE, BLOOD  I-STAT BETA HCG BLOOD, ED ([facility], [facility], [facility] ONLY)    EKG None  Radiology No results found.  Procedures Procedures (including critical care time)  Medications Ordered in ED Medications  sodium chloride flush (NS) 0.9 % injection 3 mL (3 mLs Intravenous Given 09/06/18 0744)  morphine 4 MG/ML injection 4 mg (4 mg Intravenous Given 09/06/18 0743)     Initial Impression /  Assessment and Plan / ED Course  I have reviewed the triage vital signs and the nursing notes.  Pertinent labs & imaging results that were available during my care  of the patient were reviewed by me and considered in my medical decision making (see chart for details).        Otherwise healthy 38 year old female presented to the ED today with acute onset right lower quadrant pain, nausea and vomiting.  She has tenderness at McBurney's point and there is clinical concern for appendicitis.  She does have mild leukocytosis with a WBC of 12.  All other lab work unremarkable.  Will obtain CT abdomen pelvis.  CT result shows appearance of the appendix suggest early acute appendiceal inflammation no appendicolith.  There is mild mucosal hyperenhancement with slight stranding in the mesentery at the base of the appendix.  Antibiotics were started.  She has a penicillin allergy so she was started on Cipro and Flagyl.  We will consult general surgery.  Spoke with general surgery they will admit pt.    Final Clinical Impressions(s) / ED Diagnoses   Final diagnoses:  None    ED Discharge Orders    None       Jassen Sarver, Lester KinsmanSamantha Tripp, PA-C 09/06/18 1130    Melene PlanFloyd, Dan, DO 09/06/18 1529

## 2018-09-06 NOTE — Anesthesia Procedure Notes (Signed)
Procedure Name: Intubation Date/Time: 09/06/2018 12:51 PM Performed by: Lovie Chol, CRNA Pre-anesthesia Checklist: Patient identified, Emergency Drugs available, Suction available and Patient being monitored Patient Re-evaluated:Patient Re-evaluated prior to induction Oxygen Delivery Method: Circle System Utilized Preoxygenation: Pre-oxygenation with 100% oxygen Induction Type: IV induction Ventilation: Mask ventilation without difficulty Laryngoscope Size: Miller and 2 Grade View: Grade I Tube type: Oral Tube size: 7.0 mm Number of attempts: 1 Airway Equipment and Method: Stylet and Oral airway Placement Confirmation: ETT inserted through vocal cords under direct vision,  positive ETCO2 and breath sounds checked- equal and bilateral Secured at: 20 cm Tube secured with: Tape Dental Injury: Teeth and Oropharynx as per pre-operative assessment

## 2018-09-06 NOTE — H&P (Signed)
[facility] Surgery Consult/Admission Note  Kelly Haley 08/31/1980  [id number].    Requesting MD: Dr. Adela Lank Chief Complaint/Reason for Consult: appendicitis  HPI:  Pt is a 38 yo female with a hx of tubal ligation and migraines who presented to the ED with complaints of abdominal pain. Pain was in the umbilical region, sudden onset around 1500 yesterday, non radiating, progressively worsened to severe, constant, and moved to the RLQ. Associated nausea and chills. No fever, vomiting, dysuria, hematuria, changes in bowel habits, blood in stools. Is not on blood thinners. Pt takes no daily medications.   ROS:  Review of Systems  Constitutional: Positive for chills. Negative for diaphoresis and fever.  HENT: Negative for sore throat.   Respiratory: Negative for cough and shortness of breath.   Cardiovascular: Negative for chest pain.  Gastrointestinal: Positive for abdominal pain and nausea. Negative for blood in stool, constipation, diarrhea and vomiting.  Genitourinary: Negative for dysuria.  Skin: Negative for rash.  Neurological: Negative for dizziness and loss of consciousness.  All other systems reviewed and are negative.    No family history on file.  Past Medical History:  Diagnosis Date  . Medical history non-contributory     Past Surgical History:  Procedure Laterality Date  . CARPAL TUNNEL RELEASE    . CARPAL TUNNEL RELEASE    . TUBAL LIGATION      Social History:  reports that she has quit smoking. She has never used smokeless tobacco. She reports that she does not drink alcohol or use drugs.  Allergies:  Allergies  Allergen Reactions  . Penicillins Hives and Nausea And Vomiting    Has patient had a PCN reaction causing immediate rash, facial/tongue/throat swelling, SOB or lightheadedness with hypotension: Yes Has patient had a PCN reaction causing severe rash involving mucus membranes or skin necrosis:No Has patient had a PCN reaction that required  hospitalizationYes Has patient had a PCN reaction occurring within the last 10 years: No If all of the above answers are "NO", then may proceed with Cephalospori    (Not in a hospital admission)   Blood pressure (!) 129/51, pulse 83, temperature 98 F (36.7 C), temperature source Oral, resp. rate 18, height  (1.626 m), weight 95.7 kg, last menstrual period 08/23/2018, SpO2 100 %.  Physical Exam Constitutional:      General: She is not in acute distress.    Appearance: Normal appearance. She is not diaphoretic.  HENT:     Head: Normocephalic and atraumatic.     Nose: Nose normal.     Mouth/Throat:     Lips: Pink.     Mouth: Mucous membranes are moist.     Pharynx: Oropharynx is clear.  Eyes:     General: No scleral icterus.       Right eye: No discharge.        Left eye: No discharge.     Conjunctiva/sclera: Conjunctivae normal.     Pupils: Pupils are equal, round, and reactive to light.  Neck:     Musculoskeletal: Normal range of motion and neck supple.  Cardiovascular:     Rate and Rhythm: Normal rate and regular rhythm.     Pulses:          Radial pulses are 2+ on the right side and 2+ on the left side.     Heart sounds: Normal heart sounds. No murmur.  Pulmonary:     Effort: Pulmonary effort is normal. No respiratory distress.     Breath  sounds: Normal breath sounds. No wheezing, rhonchi or rales.  Abdominal:     General: Bowel sounds are decreased. There is no distension.     Palpations: Abdomen is soft. Abdomen is not rigid.     Tenderness: There is abdominal tenderness in the right lower quadrant. There is no guarding. Positive signs include McBurney's sign.  Musculoskeletal: Normal range of motion.        General: No tenderness or deformity.  Skin:    General: Skin is warm and dry.     Findings: No rash.  Neurological:     Mental Status: She is alert and oriented to person, place, and time.  Psychiatric:        Mood and Affect: Mood normal.         Behavior: Behavior normal.     Results for orders placed or performed during the hospital encounter of 09/05/18 (from the past 48 hour(s))  Urinalysis, Routine w reflex microscopic     Status: Abnormal   Collection Time: 09/05/18 10:23 PM  Result Value Ref Range   Color, Urine YELLOW YELLOW   APPearance CLEAR CLEAR   Specific Gravity, Urine 1.021 1.005 - 1.030   pH 7.0 5.0 - 8.0   Glucose, UA NEGATIVE NEGATIVE mg/dL   Hgb urine dipstick SMALL (A) NEGATIVE   Bilirubin Urine NEGATIVE NEGATIVE   Ketones, ur NEGATIVE NEGATIVE mg/dL   Protein, ur NEGATIVE NEGATIVE mg/dL   Nitrite NEGATIVE NEGATIVE   Leukocytes,Ua NEGATIVE NEGATIVE   RBC / HPF 0-5 0 - 5 RBC/hpf   WBC, UA 0-5 0 - 5 WBC/hpf   Bacteria, UA NONE SEEN NONE SEEN   Squamous Epithelial / LPF 0-5 0 - 5    Comment: Performed at [facility], [address], [place], [place] [address]  Pregnancy, urine     Status: None   Collection Time: 09/05/18 10:23 PM  Result Value Ref Range   Preg Test, Ur NEGATIVE NEGATIVE    Comment:        THE SENSITIVITY OF THIS METHODOLOGY IS >20 mIU/mL. Performed at [facility], [address], [place], [place] [address]   Lipase, blood     Status: None   Collection Time: 09/05/18 10:29 PM  Result Value Ref Range   Lipase 39 11 - 51 U/L    Comment: Performed at [facility], [address], [place], [place] [address]  Comprehensive metabolic panel     Status: Abnormal   Collection Time: 09/05/18 10:29 PM  Result Value Ref Range   Sodium 138 135 - 145 mmol/L   Potassium 3.5 3.5 - 5.1 mmol/L   Chloride 106 98 - 111 mmol/L   CO2 24 22 - 32 mmol/L   Glucose, Bld 112 (H) 70 - 99 mg/dL   BUN 16 6 - 20 mg/dL   Creatinine, Ser 0.62 0.44 - 1.00 mg/dL   Calcium 9.3 8.9 - 69.4 mg/dL   Total Protein 7.0 6.5 - 8.1 g/dL   Albumin 3.8 3.5 - 5.0 g/dL   AST 18 15 - 41 U/L   ALT 14 0 - 44 U/L   Alkaline Phosphatase 54 38 - 126 U/L   Total Bilirubin 0.7 0.3 - 1.2 mg/dL   GFR calc non  Af Amer >60 >60 mL/min   GFR calc Af Amer >60 >60 mL/min   Anion gap 8 5 - 15    Comment: Performed at [facility], [address], [place], [place]  [address]  CBC     Status: Abnormal   Collection Time: 09/05/18 10:29 PM  Result Value Ref Range   WBC 12.0 (H) 4.0 - 10.5 K/uL   RBC 4.90 3.87 - 5.11 MIL/uL   Hemoglobin 13.9 12.0 - 15.0 g/dL   HCT 50.7 22.5 - 75.0 %   MCV 87.1 80.0 - 100.0 fL   MCH 28.4 26.0 - 34.0 pg   MCHC 32.6 30.0 - 36.0 g/dL   RDW 51.8 33.5 - 82.5 %   Platelets 287 150 - 400 K/uL   nRBC 0.0 0.0 - 0.2 %    Comment: Performed at [facility], [address] [place], [place] [address]   Ct Abdomen Pelvis W Contrast  Result Date: 09/06/2018 CLINICAL DATA:  Abdominal pain EXAM: CT ABDOMEN AND PELVIS WITH CONTRAST TECHNIQUE: Multidetector CT imaging of the abdomen and pelvis was performed using the standard protocol following bolus administration of intravenous contrast. CONTRAST:  OMNIPAQUE IOHEXOL 300 MG/ML  SOLN COMPARISON:  None. FINDINGS: Lower chest: There is slight bibasilar atelectasis. Lung bases otherwise are clear Hepatobiliary: No focal liver lesions are appreciable. Gallbladder wall is not appreciably thickened. There is no biliary duct dilatation. Pancreas: There is no appreciable pancreatic mass or inflammatory focus. Spleen: No splenic lesions are evident. Adrenals/Urinary Tract: Adrenals bilaterally appear normal. Kidneys bilaterally show no evident mass or hydronephrosis on either side. There is no appreciable renal or ureteral calculus on either side. Urinary bladder is midline with wall thickness within normal limits. Stomach/Bowel: There is no appreciable bowel wall or mesenteric thickening. There is no appreciable bowel obstruction. There is no free air or portal venous air. Vascular/Lymphatic: There is no abdominal aortic aneurysm. No vascular lesions are evident. There is a circumaortic left renal vein, an anatomic variant. There is no  adenopathy in the abdomen or pelvis. Reproductive: The uterus is anteverted. There is a somewhat lobular appearing area along the anterior lower uterine segment measuring 4.1 x 2.8 cm, a likely uterine leiomyoma. No extrauterine pelvic mass evident. Other: Measures 10 mm in thickness. There is slight enhancement of the wall of the appendix. There is subtle soft tissue stranding at the base of the appendix. There is no surrounding periappendiceal fluid or abscess. No evident perforation in the appendiceal region. There is no abscess or ascites in the abdomen or pelvis. Musculoskeletal: There are no appreciable blastic or lytic bone lesions. No intramuscular or abdominal wall lesions are evident. IMPRESSION: 1. The appearance of the appendix suggests early acute appendiceal inflammation. Appendix: Location: Extending inferiorly from the cecum at the level of the superior aspect of the iliac crest. Diameter: 10 mm Appendicolith: None Mucosal hyper-enhancement: Mild Extraluminal gas: None Periappendiceal collection: None. Slight stranding in the mesentery at the base of the appendix. 2.  No bowel obstruction.  No abscess in the abdomen or pelvis. 3.  No evident renal or ureteral calculus.  No hydronephrosis. 4. 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 Critical Value/emergent results were called by telephone at the time of interpretation on 09/06/2018 at 9:48 am to Dr. Adela Lank, ED physician , who verbally acknowledged these results. Electronically Signed   By: Bretta Bang III M.D.   On: 09/06/2018 09:48      Assessment/Plan Active Problems:   Appendicitis  Appendicitis  - admit for IV abx, IVF, OR today for lap appy  FEN: NPO, sips with meds VTE: SCD's, lovenox will start tomorrow at 0800 ID: Cipro & Flagyl 02/19>> Foley:  none Follow up: TBD  Plan: admit and OR today for lap appy   Jerre Simon, [facility] Surgery 09/06/2018, 11:10 AM Pager:  [phone] Consults: [phone] Mon-Fri 7:00 am-4:30 pm Sat-Sun 7:00 am-11:30 am

## 2018-09-06 NOTE — Op Note (Signed)
09/06/2018  1:40 PM  PATIENT:  Kelly Haley  38 y.o. female  PRE-OPERATIVE DIAGNOSIS:  Acute appendicitis  POST-OPERATIVE DIAGNOSIS:  Acute appendicitis  PROCEDURE:  Procedure(s): APPENDECTOMY LAPAROSCOPIC (N/A)  SURGEON:  Surgeon(s) and Role:    Axel Filler, MD - Primary  ANESTHESIA:   local and general  EBL:  minimal   BLOOD ADMINISTERED:none  DRAINS: none   LOCAL MEDICATIONS USED:  BUPIVICAINE   SPECIMEN:  Source of Specimen:  Appendix  DISPOSITION OF SPECIMEN:  PATHOLOGY  COUNTS:  YES  TOURNIQUET:  * No tourniquets in log *  DICTATION: .Dragon Dictation Complications: none  Counts: reported as correct x 2  Findings:  The patient had an acutely inflamed appendix, no nperforated  Specimen: Appendix  Indications for procedure:  The patient is a 38 year old female with a history of periumbilical pain localized in the right lower quadrant patient had a CT scan which revealed signs consistent with acute appendicitis the patient back in for laparoscopic appendectomy.  Details of the procedure:The patient was taken back to the operating room. The patient was placed in supine position with bilateral SCDs in place.  A foley catheter was place. The patient was prepped and draped in the usual sterile fashion.  After appropriate anitbiotics were confirmed, a time-out was confirmed and all facts were verified.    A pneumoperitoneum of 14 mmHg was obtained via a Veress needle technique in the left lower quadrant quadrant.  A 5 mm trocar and 5 mm camera then placed intra-abdominally there is no injury to any intra-abdominal organs a 10 mm infraumbilical port was placed and direct visualization as was a 5 mm port in the suprapubic area.   The appendix was identified and seen to be non-perforated.  The appendix was cleaned down to the appendiceal base. The mesoappendix was then incised and the appendiceal artery was cauterized.  The the appendiceal base was clean.  At  this time an Endoloop was placed proximallyx2 and one distally and the appendix was transected between these 2.   The appendiceal stump Endoloops appeared loose so a 3-0 silk stitch was used in a figure of eight fashion to imbricate the appendiceal stump.    A retrieval bag was then placed into the abdomen and the specimen placed in the bag. The appendiceal stump was cauterized. We evacuate the fluid from the pelvis until the effluent was clear.  The appendix and retrieval  bag was then retrieved via the supraumbilical port. #1 Vicryl was used to reapproximate the fascia at the umbilical port site x1. The skin was reapproximated all port sites 3-0 Monocryl subcuticular fashion. The skin was dressed with Dermabond.  The patient had the foley removed. The patient was awakened from general anesthesia was taken to recovery room in stable condition.      PLAN OF CARE: Admit for overnight observation  PATIENT DISPOSITION:  PACU - hemodynamically stable.   Delay start of Pharmacological VTE agent (>24hrs) due to surgical blood loss or risk of bleeding: not applicable

## 2018-09-06 NOTE — Transfer of Care (Signed)
Immediate Anesthesia Transfer of Care Note  Patient: Kelly Haley  Procedure(s) Performed: APPENDECTOMY LAPAROSCOPIC (N/A Abdomen)  Patient Location: PACU  Anesthesia Type:General  Level of Consciousness: oriented, drowsy and patient cooperative  Airway & Oxygen Therapy: Patient Spontanous Breathing and Patient connected to nasal cannula oxygen  Post-op Assessment: Report given to RN and Post -op Vital signs reviewed and stable  Post vital signs: Reviewed  Last Vitals:  Vitals Value Taken Time  BP 97/83 09/06/2018  2:02 PM  Temp    Pulse 78 09/06/2018  2:03 PM  Resp 14 09/06/2018  2:03 PM  SpO2 100 % 09/06/2018  2:03 PM  Vitals shown include unvalidated device data.  Last Pain:  Vitals:   09/06/18 1125  TempSrc:   PainSc: 7          Complications: No apparent anesthesia complications

## 2018-09-06 NOTE — Anesthesia Postprocedure Evaluation (Signed)
Anesthesia Post Note  Patient: Kelly Haley  Procedure(s) Performed: APPENDECTOMY LAPAROSCOPIC (N/A Abdomen)     Patient location during evaluation: PACU Anesthesia Type: General Level of consciousness: awake and alert Pain management: pain level controlled Vital Signs Assessment: post-procedure vital signs reviewed and stable Respiratory status: spontaneous breathing, nonlabored ventilation and respiratory function stable Cardiovascular status: blood pressure returned to baseline and stable Postop Assessment: no apparent nausea or vomiting Anesthetic complications: no    Last Vitals:  Vitals:   09/06/18 1415 09/06/18 1420  BP:    Pulse: 64   Resp: 14 16  Temp:  (!) 36.4 C  SpO2: 96%     Last Pain:  Vitals:   09/06/18 1425  TempSrc:   PainSc: Asleep                 Kamani Lewter,W. EDMOND

## 2018-09-07 ENCOUNTER — Encounter (HOSPITAL_COMMUNITY): Payer: Self-pay | Admitting: General Surgery

## 2018-09-07 LAB — CBC
HEMATOCRIT: 39.5 % (ref 36.0–46.0)
Hemoglobin: 12.8 g/dL (ref 12.0–15.0)
MCH: 28.3 pg (ref 26.0–34.0)
MCHC: 32.4 g/dL (ref 30.0–36.0)
MCV: 87.2 fL (ref 80.0–100.0)
Platelets: 311 10*3/uL (ref 150–400)
RBC: 4.53 MIL/uL (ref 3.87–5.11)
RDW: 12.6 % (ref 11.5–15.5)
WBC: 9.9 10*3/uL (ref 4.0–10.5)
nRBC: 0 % (ref 0.0–0.2)

## 2018-09-07 LAB — HIV ANTIBODY (ROUTINE TESTING W REFLEX): HIV Screen 4th Generation wRfx: NONREACTIVE

## 2018-09-07 MED ORDER — OXYCODONE HCL 5 MG PO TABS: 5.0000 mg | ORAL_TABLET | ORAL | 0 refills | Status: DC | PRN

## 2018-09-07 MED ORDER — ACETAMINOPHEN 500 MG PO TABS: 1000.0000 mg | ORAL_TABLET | Freq: Four times a day (QID) | ORAL | Status: DC | PRN

## 2018-09-07 NOTE — Discharge Summary (Signed)
     Patient ID: Kelly Haley [id number] Jun 02, 1981 38 y.o.  Admit date: 09/05/2018 Discharge date: 09/07/2018  Admitting Diagnosis: Acute appendicitis  Discharge Diagnosis Patient Active Problem List   Diagnosis Date Noted  . Appendicitis 09/06/2018    Consultants none  Reason for Admission: Pt is a 38 yo female with a hx of tubal ligation and migraines who presented to the ED with complaints of abdominal pain. Pain was in the umbilical region, sudden onset around 1500 yesterday, non radiating, progressively worsened to severe, constant, and moved to the RLQ. Associated nausea and chills. No fever, vomiting, dysuria, hematuria, changes in bowel habits, blood in stools. Is not on blood thinners. Pt takes no daily medications.   Procedures Laparoscopic appendectomy, Dr. Derrell Lolling 09/06/18  Hospital Course:  The patient was admitted and underwent a laparoscopic appendectomy.  The patient tolerated the procedure well.  On POD 1, the patient was tolerating a regular diet, voiding well, mobilizing, and pain was controlled with oral pain medications.  The patient was stable for DC home at this time with appropriate follow up made.    Physical Exam: Abd: soft, appropriately tender, +BS, ND, incisions c/d/i  Allergies as of 09/07/2018      Reactions   Penicillins Hives, Nausea And Vomiting         Medication List    STOP taking these medications   cyclobenzaprine 10 MG tablet Commonly known as:  FLEXERIL   ondansetron 8 MG tablet Commonly known as:  ZOFRAN   oxyCODONE-acetaminophen 5-325 MG tablet Commonly known as:  ROXICET     TAKE these medications   acetaminophen 500 MG tablet Commonly known as:  TYLENOL Take 2 tablets (1,000 mg total) by mouth every 6 (six) hours as needed for mild pain (or temp > 100).   docusate sodium 50 MG capsule Commonly known as:  COLACE Take 1 capsule (50 mg total) by mouth 2 (two) times daily.   ibuprofen 200 MG tablet Commonly  known as:  ADVIL,MOTRIN Take 400 mg by mouth every 6 (six) hours as needed for moderate pain. What changed:  Another medication with the same name was removed. Continue taking this medication, and follow the directions you see here.   oxyCODONE 5 MG immediate release tablet Commonly known as:  Oxy IR/ROXICODONE Take 1 tablet (5 mg total) by mouth every 4 (four) hours as needed for moderate pain.   polyethylene glycol packet Commonly known as:  MIRALAX Take 17 g by mouth daily.        Follow-up Information    [facility], [place]. Go on 09/21/2018.   Specialty:  General Surgery Why:  03/05 at 1:30 pm. Please arrive 20 minutes prior to complete paperwork. please bring photo ID and insurance card Contact information: [address] [place] [address] [phone]          Signed: Barnetta Chapel, [facility] 09/07/2018, 10:08 AM Pager: [phone]

## 2018-09-07 NOTE — Progress Notes (Signed)
Patient discharged to home with instructions. 

## 2018-09-07 NOTE — Plan of Care (Signed)
  Problem: Education: Goal: Knowledge of General Education information will improve Description Including pain rating scale, medication(s)/side effects and non-pharmacologic comfort measures Outcome: Progressing   Problem: Health Behavior/Discharge Planning: Goal: Ability to manage health-related needs will improve Outcome: Progressing   Problem: Clinical Measurements: Goal: Ability to maintain clinical measurements within normal limits will improve Outcome: Progressing Goal: Will remain free from infection Outcome: Progressing   Problem: Activity: Goal: Risk for activity intolerance will decrease Outcome: Progressing   Problem: Nutrition: Goal: Adequate nutrition will be maintained Outcome: Progressing   Problem: Coping: Goal: Level of anxiety will decrease Outcome: Progressing   Problem: Elimination: Goal: Will not experience complications related to bowel motility Outcome: Progressing Goal: Will not experience complications related to urinary retention Outcome: Progressing   Problem: Pain Managment: Goal: General experience of comfort will improve Outcome: Progressing   Problem: Safety: Goal: Ability to remain free from injury will improve Outcome: Progressing   Problem: Skin Integrity: Goal: Risk for impaired skin integrity will decrease Outcome: Progressing   Problem: Clinical Measurements: Goal: Postoperative complications will be avoided or minimized Outcome: Progressing   Problem: Skin Integrity: Goal: Demonstration of wound healing without infection will improve Outcome: Progressing   

## 2019-05-22 DIAGNOSIS — H903 Sensorineural hearing loss, bilateral: Secondary | ICD-10-CM | POA: Insufficient documentation

## 2019-05-22 DIAGNOSIS — H93A1 Pulsatile tinnitus, right ear: Secondary | ICD-10-CM | POA: Insufficient documentation

## 2019-05-31 ENCOUNTER — Other Ambulatory Visit: Payer: Self-pay | Admitting: Physician Assistant

## 2019-09-10 ENCOUNTER — Telehealth: Payer: Self-pay

## 2019-09-10 NOTE — Telephone Encounter (Signed)
NOTES ON FILE FROM EAR NOSE THROAT [phone] SENT REFERRAL TO SCHEDULING

## 2019-09-10 NOTE — Telephone Encounter (Signed)
NOTES ON FILE FROM EAR NOSE AND THROAT [phone], SENT REFERRAL TO SCHEDULING

## 2019-10-03 NOTE — Progress Notes (Deleted)
Cardiology Office Note:   Date:  10/03/2019  NAME:  Kelly Haley    MRN: [id number] DOB:  [birth year]   PCP:  [facility] [place]  Cardiologist:  No primary care provider on file.  Electrophysiologist:  None   Referring MD: Jolene Provost, PA-C   No chief complaint on file. ***  History of Present Illness:   Kelly Haley is a 39 y.o. female with a hx of appendectomy who is being seen today for the evaluation of pulsatile tinnitus at the request of Jolene Provost, [place]. Evaluated by ENT and had MRI brain that was negative. Referred to [place] for evaluation.   Past Medical History: Past Medical History:  Diagnosis Date  . Medical history non-contributory     Past Surgical History: Past Surgical History:  Procedure Laterality Date  . APPENDECTOMY  09/06/2018  . CARPAL TUNNEL RELEASE    . CARPAL TUNNEL RELEASE    . LAPAROSCOPIC APPENDECTOMY N/A 09/06/2018   Procedure: APPENDECTOMY LAPAROSCOPIC;  Surgeon: Ralene Ok, MD;  Location: [facility] OR;  Service: General;  Laterality: N/A;  . TUBAL LIGATION      Current Medications: No outpatient medications have been marked as taking for the 10/05/19 encounter (Appointment) with O'Neal, Cassie Freer, MD.     Allergies:    Penicillins   Social History: Social History   Socioeconomic History  . Marital status: Single    Spouse name: Not on file  . Number of children: Not on file  . Years of education: Not on file  . Highest education level: Not on file  Occupational History  . Not on file  Tobacco Use  . Smoking status: Never Smoker  . Smokeless tobacco: Never Used  Substance and Sexual Activity  . Alcohol use: No  . Drug use: No  . Sexual activity: Yes    Birth control/protection: Condom, Surgical  Other Topics Concern  . Not on file  Social History Narrative  . Not on file   Social Determinants of Health   Financial Resource Strain:   . Difficulty of Paying Living Expenses:   Food  Insecurity:   . Worried About Charity fundraiser in the Last Year:   . Arboriculturist in the Last Year:   Transportation Needs:   . Film/video editor (Medical):   Marland Kitchen Lack of Transportation (Non-Medical):   Physical Activity:   . Days of Exercise per Week:   . Minutes of Exercise per Session:   Stress:   . Feeling of Stress :   Social Connections:   . Frequency of Communication with Friends and Family:   . Frequency of Social Gatherings with Friends and Family:   . Attends Religious Services:   . Active Member of Clubs or Organizations:   . Attends Archivist Meetings:   Marland Kitchen Marital Status:      Family History: The patient's ***family history is not on file.  ROS:   All other ROS reviewed and negative. Pertinent positives noted in the HPI.     EKGs/Labs/Other Studies Reviewed:   The following studies were personally reviewed by me today:  EKG:  EKG is *** ordered today.  The ekg ordered today demonstrates ***, and was personally reviewed by me.   Recent Labs: No results found for requested labs within last 8760 hours.   Recent Lipid Panel No results found for: CHOL, TRIG, HDL, CHOLHDL, VLDL, LDLCALC, LDLDIRECT  Physical Exam:   VS:  There were no vitals  taken for this visit.   Wt Readings from Last 3 Encounters:  09/06/18 211 lb (95.7 kg)  08/14/18 211 lb (95.7 kg)  02/14/16 220 lb (99.8 kg)    General: Well nourished, well developed, in no acute distress Heart: Atraumatic, normal size  Eyes: PEERLA, EOMI  Neck: Supple, no JVD Endocrine: No thryomegaly Cardiac: Normal S1, S2; RRR; no murmurs, rubs, or gallops Lungs: Clear to auscultation bilaterally, no wheezing, rhonchi or rales  Abd: Soft, nontender, no hepatomegaly  Ext: No edema, pulses 2+ Musculoskeletal: No deformities, BUE and BLE strength normal and equal Skin: Warm and dry, no rashes   Neuro: Alert and oriented to person, place, time, and situation, CNII-XII grossly intact, no focal  deficits  Psych: Normal mood and affect   ASSESSMENT:   Kelly Haley is a 39 y.o. female who presents for the following: No diagnosis found.  PLAN:   There are no diagnoses linked to this encounter.  Disposition: No follow-ups on file.  Medication Adjustments/Labs and Tests Ordered: Current medicines are reviewed at length with the patient today.  Concerns regarding medicines are outlined above.  No orders of the defined types were placed in this encounter.  No orders of the defined types were placed in this encounter.   There are no Patient Instructions on file for this visit.   Time Spent with Patient: I have spent a total of *** minutes with patient reviewing hospital notes, telemetry, EKGs, labs and examining the patient as well as establishing an assessment and plan that was discussed with the patient.  > 50% of time was spent in direct patient care.  Signed, Lenna Gilford. Flora Lipps, MD [facility]  [address], [address] [place], [place] [address] [phone]  10/03/2019 7:57 AM

## 2019-10-05 ENCOUNTER — Ambulatory Visit: Payer: BLUE CROSS/BLUE SHIELD | Admitting: Cardiovascular Disease

## 2019-11-01 NOTE — Progress Notes (Deleted)
Cardiology Office Note:    Date:  11/01/2019   ID:  Kelly Haley, DOB [birth year], MRN [id number]  PCP:  [facility]  Cardiologist:  No primary care provider on file.  Electrophysiologist:  None   Referring MD: Aquilla Hacker, PA-C   No chief complaint on file. ***  History of Present Illness:    Kelly Haley is a 39 y.o. female with a hx of ***who is referred by Aquilla Hacker, PA for evaluation of pulsatile tinnitus.  Follows with ENT at [facility], underwent brain MRI which did not show any abnormalities to account for the patient's symptoms.  Past Medical History:  Diagnosis Date  . Medical history non-contributory     Past Surgical History:  Procedure Laterality Date  . APPENDECTOMY  09/06/2018  . CARPAL TUNNEL RELEASE    . CARPAL TUNNEL RELEASE    . LAPAROSCOPIC APPENDECTOMY N/A 09/06/2018   Procedure: APPENDECTOMY LAPAROSCOPIC;  Surgeon: Axel Filler, MD;  Location: [facility] OR;  Service: General;  Laterality: N/A;  . TUBAL LIGATION      Current Medications: No outpatient medications have been marked as taking for the 11/02/19 encounter (Appointment) with Little Ishikawa, MD.     Allergies:   Penicillins   Social History   Socioeconomic History  . Marital status: Single    Spouse name: Not on file  . Number of children: Not on file  . Years of education: Not on file  . Highest education level: Not on file  Occupational History  . Not on file  Tobacco Use  . Smoking status: Never Smoker  . Smokeless tobacco: Never Used  Substance and Sexual Activity  . Alcohol use: No  . Drug use: No  . Sexual activity: Yes    Birth control/protection: Condom, Surgical  Other Topics Concern  . Not on file  Social History Narrative  . Not on file   Social Determinants of Health   Financial Resource Strain:   . Difficulty of Paying Living Expenses:   Food Insecurity:   . Worried About Programme researcher, broadcasting/film/video in the  Last Year:   . Barista in the Last Year:   Transportation Needs:   . Freight forwarder (Medical):   Marland Kitchen Lack of Transportation (Non-Medical):   Physical Activity:   . Days of Exercise per Week:   . Minutes of Exercise per Session:   Stress:   . Feeling of Stress :   Social Connections:   . Frequency of Communication with Friends and Family:   . Frequency of Social Gatherings with Friends and Family:   . Attends Religious Services:   . Active Member of Clubs or Organizations:   . Attends Banker Meetings:   Marland Kitchen Marital Status:      Family History: The patient's ***family history is not on file.  ROS:   Please see the history of present illness.    *** All other systems reviewed and are negative.  EKGs/Labs/Other Studies Reviewed:    The following studies were reviewed today: ***  EKG:  EKG is *** ordered today.  The ekg ordered today demonstrates ***  Recent Labs: No results found for requested labs within last 8760 hours.  Recent Lipid Panel No results found for: CHOL, TRIG, HDL, CHOLHDL, VLDL, LDLCALC, LDLDIRECT  Physical Exam:    VS:  There were no vitals taken for this visit.    Wt Readings from Last 3 Encounters:  09/06/18 211 lb (  95.7 kg)  08/14/18 211 lb (95.7 kg)  02/14/16 220 lb (99.8 kg)     GEN: *** Well nourished, well developed in no acute distress HEENT: Normal NECK: No JVD; No carotid bruits LYMPHATICS: No lymphadenopathy CARDIAC: ***RRR, no murmurs, rubs, gallops RESPIRATORY:  Clear to auscultation without rales, wheezing or rhonchi  ABDOMEN: Soft, non-tender, non-distended MUSCULOSKELETAL:  No edema; No deformity  SKIN: Warm and dry NEUROLOGIC:  Alert and oriented x 3 PSYCHIATRIC:  Normal affect   ASSESSMENT:    No diagnosis found. PLAN:    In order of problems listed above:  1. ***   Medication Adjustments/Labs and Tests Ordered: Current medicines are reviewed at length with the patient today.  Concerns  regarding medicines are outlined above.  No orders of the defined types were placed in this encounter.  No orders of the defined types were placed in this encounter.   There are no Patient Instructions on file for this visit.   Signed, Donato Heinz, MD  11/01/2019 10:37 PM    [place]

## 2019-11-02 ENCOUNTER — Ambulatory Visit: Payer: Self-pay | Admitting: Cardiology

## 2019-11-12 ENCOUNTER — Other Ambulatory Visit: Payer: Self-pay

## 2019-11-12 ENCOUNTER — Encounter (HOSPITAL_BASED_OUTPATIENT_CLINIC_OR_DEPARTMENT_OTHER): Payer: Self-pay | Admitting: Emergency Medicine

## 2019-11-12 ENCOUNTER — Emergency Department (HOSPITAL_BASED_OUTPATIENT_CLINIC_OR_DEPARTMENT_OTHER): Payer: 59

## 2019-11-12 ENCOUNTER — Emergency Department (HOSPITAL_BASED_OUTPATIENT_CLINIC_OR_DEPARTMENT_OTHER)
Admission: EM | Admit: 2019-11-12 | Discharge: 2019-11-13 | Disposition: A | Discharge status: Home / Self Care | Payer: 59 | Attending: Emergency Medicine | Admitting: Emergency Medicine

## 2019-11-12 ENCOUNTER — Ambulatory Visit (HOSPITAL_COMMUNITY)
Admission: EM | Admit: 2019-11-12 | Discharge: 2019-11-12 | Disposition: A | Discharge status: Home / Self Care | Payer: 59 | Source: Home / Self Care

## 2019-11-12 DIAGNOSIS — Z3202 Encounter for pregnancy test, result negative: Secondary | ICD-10-CM

## 2019-11-12 DIAGNOSIS — R3915 Urgency of urination: Secondary | ICD-10-CM

## 2019-11-12 DIAGNOSIS — M545 Low back pain, unspecified: Secondary | ICD-10-CM

## 2019-11-12 DIAGNOSIS — R1031 Right lower quadrant pain: Secondary | ICD-10-CM | POA: Diagnosis present

## 2019-11-12 DIAGNOSIS — R350 Frequency of micturition: Secondary | ICD-10-CM | POA: Diagnosis not present

## 2019-11-12 DIAGNOSIS — Z880 Allergy status to penicillin: Secondary | ICD-10-CM | POA: Insufficient documentation

## 2019-11-12 DIAGNOSIS — R109 Unspecified abdominal pain: Secondary | ICD-10-CM | POA: Diagnosis not present

## 2019-11-12 LAB — CBC
HCT: 45.4 % (ref 36.0–46.0)
Hemoglobin: 15 g/dL (ref 12.0–15.0)
MCH: 29 pg (ref 26.0–34.0)
MCHC: 33 g/dL (ref 30.0–36.0)
MCV: 87.6 fL (ref 80.0–100.0)
Platelets: 330 10*3/uL (ref 150–400)
RBC: 5.18 MIL/uL — ABNORMAL HIGH (ref 3.87–5.11)
RDW: 12.4 % (ref 11.5–15.5)
WBC: 9.6 10*3/uL (ref 4.0–10.5)
nRBC: 0 % (ref 0.0–0.2)

## 2019-11-12 LAB — BASIC METABOLIC PANEL
Anion gap: 11 (ref 5–15)
BUN: 15 mg/dL (ref 6–20)
CO2: 23 mmol/L (ref 22–32)
Calcium: 9.3 mg/dL (ref 8.9–10.3)
Chloride: 105 mmol/L (ref 98–111)
Creatinine, Ser: 0.74 mg/dL (ref 0.44–1.00)
GFR calc Af Amer: >60 mL/min (ref >60)
GFR calc non Af Amer: >60 mL/min (ref >60)
Glucose, Bld: 83 mg/dL (ref 70–99)
Potassium: 3.4 mmol/L — ABNORMAL LOW (ref 3.5–5.1)
Sodium: 139 mmol/L (ref 135–145)

## 2019-11-12 LAB — POCT URINALYSIS DIP (DEVICE)
Bilirubin Urine: NEGATIVE
Glucose, UA: NEGATIVE mg/dL
Hgb urine dipstick: NEGATIVE
Ketones, ur: NEGATIVE mg/dL
Leukocytes,Ua: NEGATIVE
Nitrite: NEGATIVE
Protein, ur: NEGATIVE mg/dL
Specific Gravity, Urine: 1.015 (ref 1.005–1.030)
Urobilinogen, UA: 0.2 mg/dL (ref 0.0–1.0)
pH: 5.5 (ref 5.0–8.0)

## 2019-11-12 LAB — POC URINE PREG, ED: Preg Test, Ur: NEGATIVE

## 2019-11-12 MED ORDER — TAMSULOSIN HCL 0.4 MG PO CAPS: 0.4000 mg | ORAL_CAPSULE | Freq: Every day | ORAL | 0 refills | Status: AC

## 2019-11-12 MED ORDER — PHENAZOPYRIDINE HCL 200 MG PO TABS: 200.0000 mg | ORAL_TABLET | Freq: Three times a day (TID) | ORAL | 0 refills | Status: DC

## 2019-11-12 NOTE — ED Provider Notes (Signed)
[facility]    CSN: [id number] Arrival date & time: 11/12/19  1656      History   Chief Complaint Chief Complaint  Patient presents with  . Dysuria    HPI Kelly Haley is a 39 y.o. female.   Reports that she is having extreme discomfort to the right flank.  Thinks that she may have a UTI.  Reports urinary frequency, urgency, stress incontinence.  Denies fever, chills, body aches.  No attempt to treat this at home.  Per chart review, medical history significant for appendicitis and appendectomy.  No known history of kidney stones.  ROS per HPI   The history is provided by the patient.    Past Medical History:  Diagnosis Date  . Medical history non-contributory     Patient Active Problem List   Diagnosis Date Noted  . Appendicitis 09/06/2018    Past Surgical History:  Procedure Laterality Date  . APPENDECTOMY  09/06/2018  . CARPAL TUNNEL RELEASE    . CARPAL TUNNEL RELEASE    . LAPAROSCOPIC APPENDECTOMY N/A 09/06/2018   Procedure: APPENDECTOMY LAPAROSCOPIC;  Surgeon: Axel Filler, MD;  Location: [facility] OR;  Service: General;  Laterality: N/A;  . TUBAL LIGATION      OB History    Gravida  4   Para  3   Term  3   Preterm      AB  1   Living  3     SAB      TAB  1   Ectopic      Multiple      Live Births               Home Medications    Prior to Admission medications   Medication Sig Start Date End Date Taking? Authorizing Provider  acetaminophen (TYLENOL) 500 MG tablet Take 2 tablets (1,000 mg total) by mouth every 6 (six) hours as needed for mild pain (or temp > 100). 09/07/18   Barnetta Chapel, PA-C  ibuprofen (ADVIL) 800 MG tablet Take 1 tablet (800 mg total) by mouth every 6 (six) hours as needed for moderate pain. 11/13/19   Gilda Crease, MD  methocarbamol (ROBAXIN) 500 MG tablet Take 1 tablet (500 mg total) by mouth every 8 (eight) hours as needed for muscle spasms. 11/13/19   Gilda Crease, MD    methylPREDNISolone (MEDROL DOSEPAK) 4 MG TBPK tablet As directed 11/13/19   Gilda Crease, MD  phenazopyridine (PYRIDIUM) 200 MG tablet Take 1 tablet (200 mg total) by mouth 3 (three) times daily. 11/12/19   Moshe Cipro, NP  tamsulosin (FLOMAX) 0.4 MG CAPS capsule Take 1 capsule (0.4 mg total) by mouth daily. 11/12/19 12/12/19  Moshe Cipro, NP    Family History No family history on file.  Social History Social History   Tobacco Use  . Smoking status: Never Smoker  . Smokeless tobacco: Never Used  Substance Use Topics  . Alcohol use: No  . Drug use: No     Allergies   Penicillins   Review of Systems Review of Systems   Physical Exam Triage Vital Signs ED Triage Vitals [11/12/19 1747]  Enc Vitals Group     BP 116/70     Pulse Rate 97     Resp 16     Temp 98.8 F (37.1 C)     Temp src      SpO2 97 %     Weight      Height  Head Circumference      Peak Flow      Pain Score 6     Pain Loc      Pain Edu?      Excl. in GC?    No data found.  Updated Vital Signs BP 116/70   Pulse 97   Temp 98.8 F (37.1 C)   Resp 16   SpO2 97%   Visual Acuity Right Eye Distance:   Left Eye Distance:   Bilateral Distance:    Right Eye Near:   Left Eye Near:    Bilateral Near:     Physical Exam Vitals and nursing note reviewed.  Constitutional:      General: She is not in acute distress.    Appearance: Normal appearance. She is well-developed. She is ill-appearing.  HENT:     Head: Normocephalic and atraumatic.  Eyes:     Conjunctiva/sclera: Conjunctivae normal.  Cardiovascular:     Rate and Rhythm: Normal rate and regular rhythm.     Heart sounds: No murmur.  Pulmonary:     Effort: Pulmonary effort is normal. No respiratory distress.     Breath sounds: Normal breath sounds.  Abdominal:     General: Bowel sounds are normal. There is no distension.     Palpations: Abdomen is soft. There is no mass.     Tenderness: There is abdominal  tenderness (Suprapubic, right flank). There is no right CVA tenderness, left CVA tenderness, guarding or rebound.     Hernia: No hernia is present.  Musculoskeletal:        General: Normal range of motion.     Cervical back: Normal range of motion and neck supple.  Skin:    General: Skin is warm and dry.     Capillary Refill: Capillary refill takes less than 2 seconds.  Neurological:     General: No focal deficit present.     Mental Status: She is alert and oriented to person, place, and time.  Psychiatric:        Mood and Affect: Mood normal.        Behavior: Behavior normal.        Thought Content: Thought content normal.      UC Treatments / Results  Labs (all labs ordered are listed, but only abnormal results are displayed) Labs Reviewed  POCT URINALYSIS DIP (DEVICE)  POC URINE PREG, ED    EKG   Radiology   Procedures Procedures (including critical care time)  Medications Ordered in UC Medications - No data to display  Initial Impression / Assessment and Plan / UC Course  I have reviewed the triage vital signs and the nursing notes.  Pertinent labs & imaging results that were available during my care of the patient were reviewed by me and considered in my medical decision making (see chart for details).    Right flank pain Urinary frequency/urgency: Presents with right flank pain, urinary frequency and urgency for the last 2 weeks.  Reports history of UTIs.  Denies vaginal symptoms.  Suprapubic abdominal tenderness on exam.  UA negative for infection today.  Recommended that patient be further evaluated for the possibility of kidney stones or other causes of her pain.  Patient will go to med [facility] for further evaluation.  Discussed with patient that her pain could be coming from something more serious that may possibly require surgical intervention.  I cannot discern why she is having pain in office at this visit today.  [employer]  ahead and sent in  prescriptions for tamsulosin 0.4 mg daily as well as Pyridium 2 mg every 6 hours as needed for dysuria.  Patient verbalizes understanding and is in agreement with treatment plan. Final Clinical Impressions(s) / UC Diagnoses   Final diagnoses:  Urinary frequency  Urinary urgency  Right flank pain     Discharge Instructions     I would have you go to an ER to rule out possible kidney stone or other causes of your pain.  UA in office is negative for infection.  Pregnancy test is negative in the office today.      ED Prescriptions    Medication Sig Dispense Auth. Provider   tamsulosin (FLOMAX) 0.4 MG CAPS capsule Take 1 capsule (0.4 mg total) by mouth daily. 30 capsule Faustino Congress, NP   phenazopyridine (PYRIDIUM) 200 MG tablet Take 1 tablet (200 mg total) by mouth 3 (three) times daily. 6 tablet Faustino Congress, NP     PDMP not reviewed this encounter.   Faustino Congress, NP 11/13/19 2102

## 2019-11-12 NOTE — ED Triage Notes (Addendum)
Rt flank pain x 2 weeks , went to [facility] and sent for further  Due to urine being neg, has been nauseated  And feeling bad since all started

## 2019-11-12 NOTE — ED Triage Notes (Signed)
Pt c/o R flank pain, dysuria, and frequency x 2 weeks

## 2019-11-12 NOTE — Discharge Instructions (Addendum)
I would have you go to an ER to rule out possible kidney stone or other causes of your pain.  UA in office is negative for infection.  Pregnancy test is negative in the office today.

## 2019-11-12 NOTE — ED Notes (Signed)
Patient transported to CT 

## 2019-11-12 NOTE — ED Provider Notes (Signed)
[facility] EMERGENCY DEPARTMENT Provider Note   CSN: [id number] Arrival date & time: 11/12/19  2012     History Chief Complaint  Patient presents with  . Flank Pain    Kelly Haley is a 39 y.o. female.  Patient presents to the emergency department as a referral from urgent care.  Patient reports that she has been having pain in the right side of her back and flank area for 2 weeks.  She reports that she wakes up in the middle of the night and feels like she needs to urinate.  Pain intensifies during the course of the night usually.  She has, however, noticed that it hurts when she moves around.  She denies any back injury.        Past Medical History:  Diagnosis Date  . Medical history non-contributory     Patient Active Problem List   Diagnosis Date Noted  . Appendicitis 09/06/2018    Past Surgical History:  Procedure Laterality Date  . APPENDECTOMY  09/06/2018  . CARPAL TUNNEL RELEASE    . CARPAL TUNNEL RELEASE    . LAPAROSCOPIC APPENDECTOMY N/A 09/06/2018   Procedure: APPENDECTOMY LAPAROSCOPIC;  Surgeon: Axel Filler, MD;  Location: [facility] OR;  Service: General;  Laterality: N/A;  . TUBAL LIGATION       OB History    Gravida  4   Para  3   Term  3   Preterm      AB  1   Living  3     SAB      TAB  1   Ectopic      Multiple      Live Births              No family history on file.  Social History   Tobacco Use  . Smoking status: Never Smoker  . Smokeless tobacco: Never Used  Substance Use Topics  . Alcohol use: No  . Drug use: No    Home Medications Prior to Admission medications   Medication Sig Start Date End Date Taking? Authorizing Provider  acetaminophen (TYLENOL) 500 MG tablet Take 2 tablets (1,000 mg total) by mouth every 6 (six) hours as needed for mild pain (or temp > 100). 09/07/18   Barnetta Chapel, PA-C  ibuprofen (ADVIL) 800 MG tablet Take 1 tablet (800 mg total) by mouth every 6 (six) hours as needed  for moderate pain. 11/13/19   Gilda Crease, MD  methocarbamol (ROBAXIN) 500 MG tablet Take 1 tablet (500 mg total) by mouth every 8 (eight) hours as needed for muscle spasms. 11/13/19   Gilda Crease, MD  methylPREDNISolone (MEDROL DOSEPAK) 4 MG TBPK tablet As directed 11/13/19   Gilda Crease, MD  phenazopyridine (PYRIDIUM) 200 MG tablet Take 1 tablet (200 mg total) by mouth 3 (three) times daily. 11/12/19   Moshe Cipro, NP  tamsulosin (FLOMAX) 0.4 MG CAPS capsule Take 1 capsule (0.4 mg total) by mouth daily. 11/12/19 12/12/19  Moshe Cipro, NP    Allergies    Penicillins  Review of Systems   Review of Systems  Genitourinary: Positive for flank pain.  Musculoskeletal: Positive for back pain.  All other systems reviewed and are negative.   Physical Exam Updated Vital Signs BP 111/65 (BP Location: Right Arm)   Pulse 68   Temp 97.6 F (36.4 C) (Oral)   Resp 16   SpO2 100%   Physical Exam Vitals and nursing note reviewed.  Constitutional:  General: She is not in acute distress.    Appearance: Normal appearance. She is well-developed.  HENT:     Head: Normocephalic and atraumatic.     Right Ear: Hearing normal.     Left Ear: Hearing normal.     Nose: Nose normal.  Eyes:     Conjunctiva/sclera: Conjunctivae normal.     Pupils: Pupils are equal, round, and reactive to light.  Cardiovascular:     Rate and Rhythm: Regular rhythm.     Heart sounds: S1 normal and S2 normal. No murmur. No friction rub. No gallop.   Pulmonary:     Effort: Pulmonary effort is normal. No respiratory distress.     Breath sounds: Normal breath sounds.  Chest:     Chest wall: No tenderness.  Abdominal:     General: Bowel sounds are normal.     Palpations: Abdomen is soft.     Tenderness: There is no abdominal tenderness. There is no guarding or rebound. Negative signs include Murphy's sign and McBurney's sign.     Hernia: No hernia is present.    Musculoskeletal:     Cervical back: Normal range of motion and neck supple.     Lumbar back: Spasms and tenderness present. Decreased range of motion.       Back:  Skin:    General: Skin is warm and dry.     Findings: No rash.  Neurological:     Mental Status: She is alert and oriented to person, place, and time.     GCS: GCS eye subscore is 4. GCS verbal subscore is 5. GCS motor subscore is 6.     Cranial Nerves: No cranial nerve deficit.     Sensory: No sensory deficit.     Coordination: Coordination normal.  Psychiatric:        Speech: Speech normal.        Behavior: Behavior normal.        Thought Content: Thought content normal.     ED Results / Procedures / Treatments   Labs (all labs ordered are listed, but only abnormal results are displayed) Labs Reviewed  BASIC METABOLIC PANEL - Abnormal; Notable for the following components:      Result Value   Potassium 3.4 (*)    All other components within normal limits  CBC - Abnormal; Notable for the following components:   RBC 5.18 (*)    All other components within normal limits    EKG None  Radiology CT RENAL STONE STUDY  Result Date: 11/12/2019 CLINICAL DATA:  Right-sided flank pain, question kidney stone EXAM: CT ABDOMEN AND PELVIS WITHOUT CONTRAST TECHNIQUE: Multidetector CT imaging of the abdomen and pelvis was performed following the standard protocol without IV contrast. COMPARISON:  None. FINDINGS: Lower chest: The visualized heart size within normal limits. No pericardial fluid/thickening. No hiatal hernia. The visualized portions of the lungs are clear. Hepatobiliary: Although limited due to the lack of intravenous contrast, normal in appearance without gross focal abnormality. No evidence of calcified gallstones or biliary ductal dilatation. Pancreas:  Unremarkable.  No surrounding inflammatory changes. Spleen: Normal in size. Although limited due to the lack of intravenous contrast, normal in appearance.  Adrenals/Urinary Tract: Both adrenal glands appear normal. The kidneys and collecting system appear normal without evidence of urinary tract calculus or hydronephrosis. Bladder is unremarkable. Stomach/Bowel: The stomach, small bowel, and colon are normal in appearance. No inflammatory changes or obstructive findings. Vascular/Lymphatic: There are no enlarged abdominal or pelvic lymph nodes. No  significant gross vascular findings are present. Reproductive: The uterus and adnexa are unremarkable. Other: No evidence of abdominal wall mass or hernia. Musculoskeletal: No acute or significant osseous findings. IMPRESSION: No renal or collecting system calculi. No other acute intra-abdominal or pelvic pathology to explain the patient's symptoms. Electronically Signed   By: Prudencio Pair M.D.   On: 11/12/2019 23:53    Procedures Procedures (including critical care time)  Medications Ordered in ED Medications - No data to display  ED Course  I have reviewed the triage vital signs and the nursing notes.  Pertinent labs & imaging results that were available during my care of the patient were reviewed by me and considered in my medical decision making (see chart for details).    MDM Rules/Calculators/A&P                      Patient presents to the emergency department for evaluation of right-sided back pain.  She reports that she wakes up at night from the pain.  She thought she had increased urination because she would need to urinate when she wakes up and that is not typical for her.  I suspect that the back pain is is waking her up and then she notices that she needs to urinate, this is not urinary frequency or dysuria.  Urinalysis performed at urgent care earlier was reviewed and is normal.  Lab work is normal here in the ER.  Additionally, CT renal scan did not show any abnormality.  She has had previous appendectomy.  There are no pelvic symptoms.  Presentation is most consistent with musculoskeletal  back pain.  Final Clinical Impression(s) / ED Diagnoses Final diagnoses:  Lumbar pain    Rx / DC Orders ED Discharge Orders         Ordered    ibuprofen (ADVIL) 800 MG tablet  Every 6 hours PRN     11/13/19 0011    methocarbamol (ROBAXIN) 500 MG tablet  Every 8 hours PRN     11/13/19 0011    methylPREDNISolone (MEDROL DOSEPAK) 4 MG TBPK tablet     11/13/19 0011           Orpah Greek, MD 11/13/19 0011

## 2019-11-13 MED ORDER — METHOCARBAMOL 500 MG PO TABS: 500.0000 mg | ORAL_TABLET | Freq: Three times a day (TID) | ORAL | 0 refills | Status: DC | PRN

## 2019-11-13 MED ORDER — IBUPROFEN 800 MG PO TABS: 800.0000 mg | ORAL_TABLET | Freq: Four times a day (QID) | ORAL | 0 refills | Status: DC | PRN

## 2019-11-13 MED ORDER — METHYLPREDNISOLONE 4 MG PO TBPK: ORAL_TABLET | ORAL | 0 refills | Status: DC

## 2019-11-19 ENCOUNTER — Encounter: Payer: Self-pay | Admitting: Physician Assistant

## 2020-04-04 IMAGING — CT CT ABD-PELV W/ CM
2 of 7 series · 14 of 46 positions shown, 18 images · IV contrast (Omni 300)
Comparison: None.

CLINICAL DATA: Abdominal pain

EXAM:
CT ABDOMEN AND PELVIS WITH CONTRAST
TECHNIQUE: Multidetector CT imaging of the abdomen and pelvis was performed
using the standard protocol following bolus administration of
intravenous contrast.
CONTRAST:  100mL OMNIPAQUE IOHEXOL 300 MG/ML  SOLN

[Series 3: a/p w/ 5mm · axial · 0.98mm/px · z∈[+743,+1173]mm · 11 of 101 slices shown, 15 images]
[im 10/101  soft-tissue]
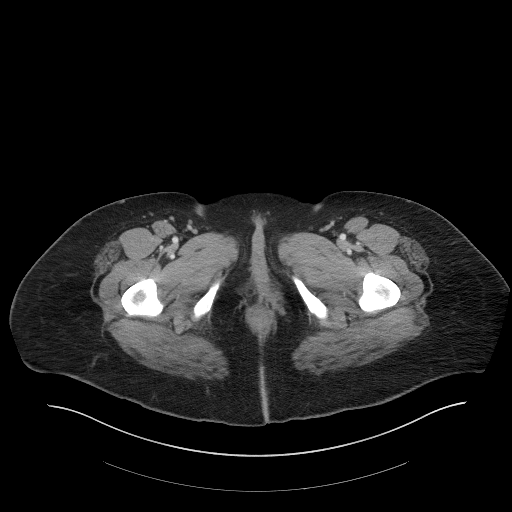
[im 10/101  bone]
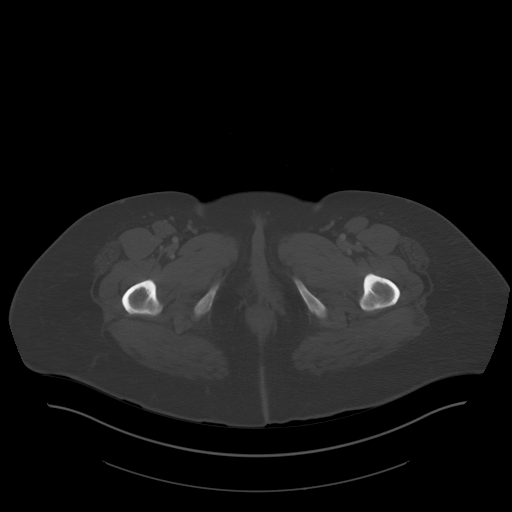
[im 19/101  soft-tissue]
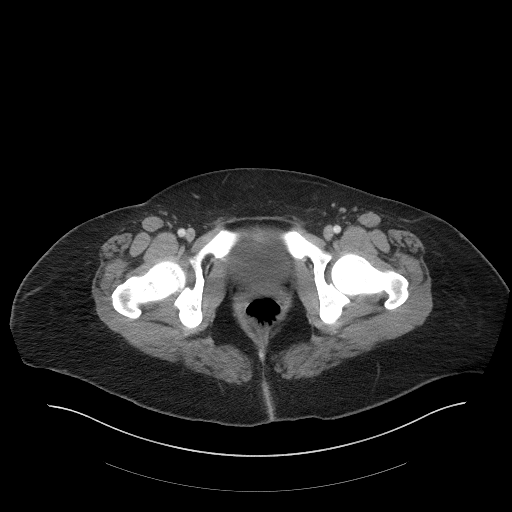
[im 28/101  soft-tissue]
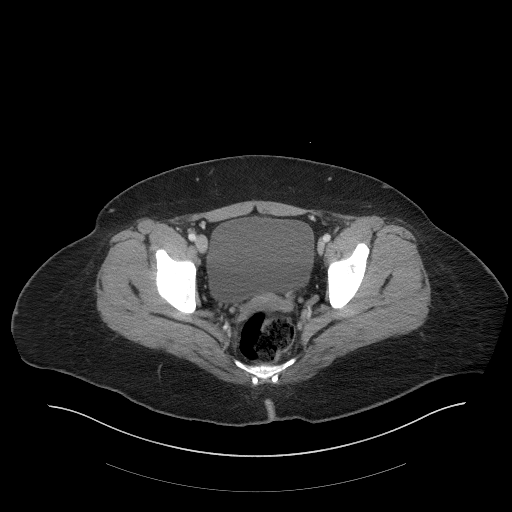
[im 41/101  soft-tissue]
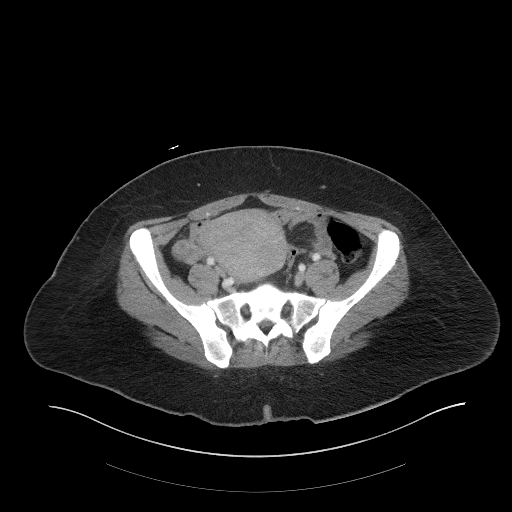
[im 51/101  soft-tissue]
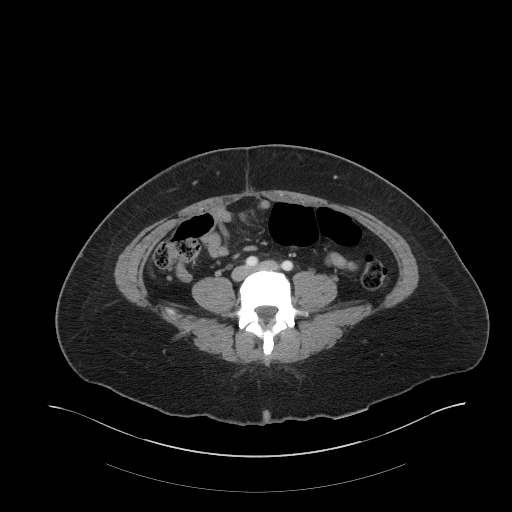
[im 60/101  soft-tissue]
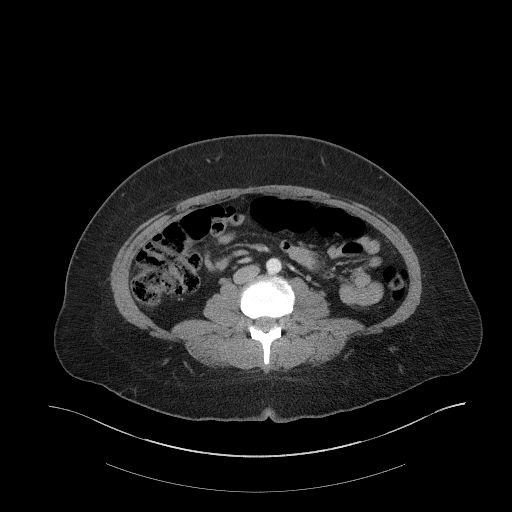
[im 73/101  soft-tissue]
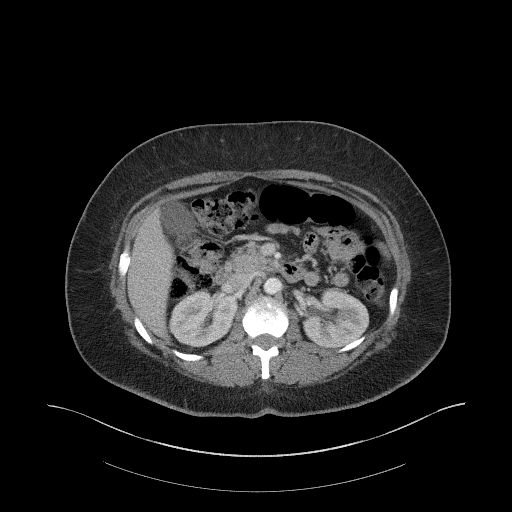
[im 82/101  soft-tissue]
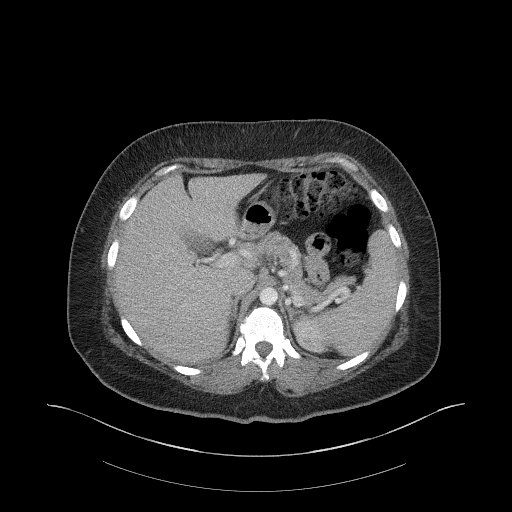
[im 82/101  lung]
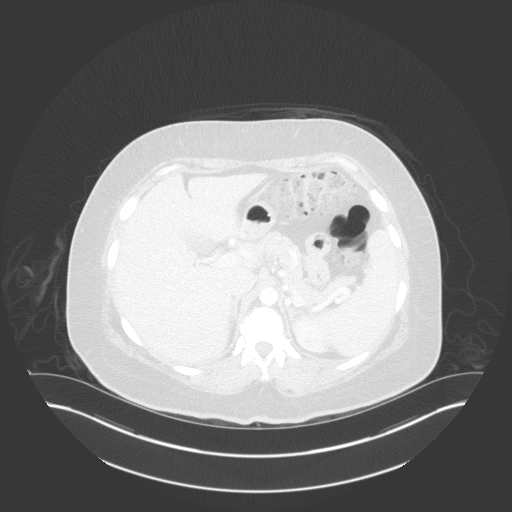
[im 87/101  lung]
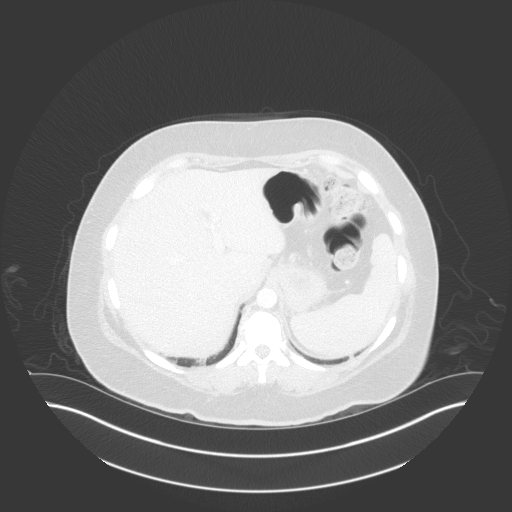
[im 91/101  soft-tissue]
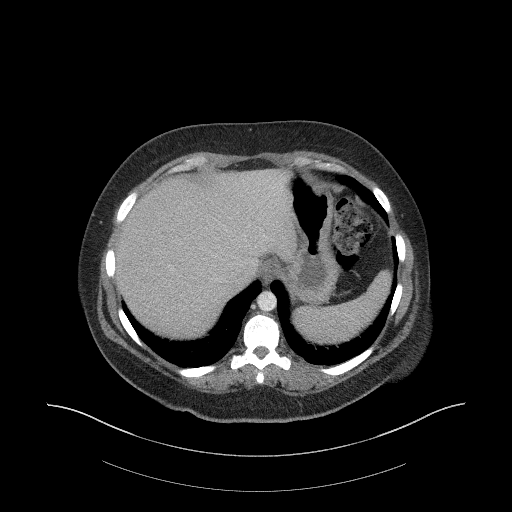
[im 91/101  lung]
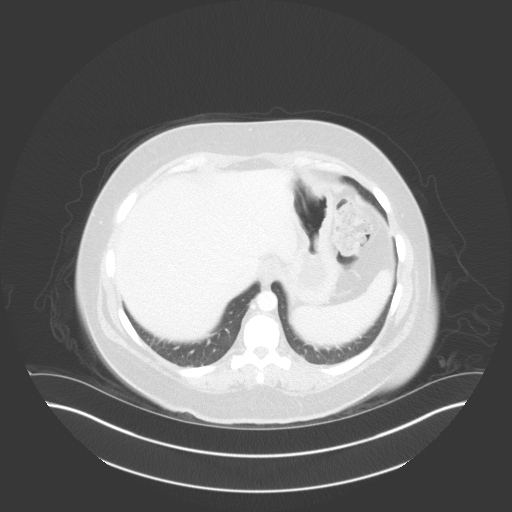
[im 91/101  bone]
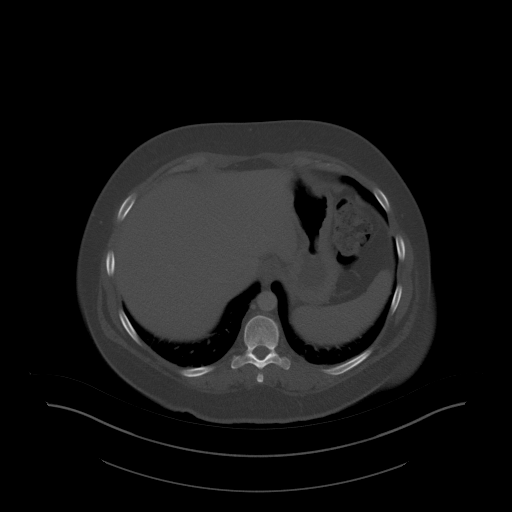
[im 96/101  lung]
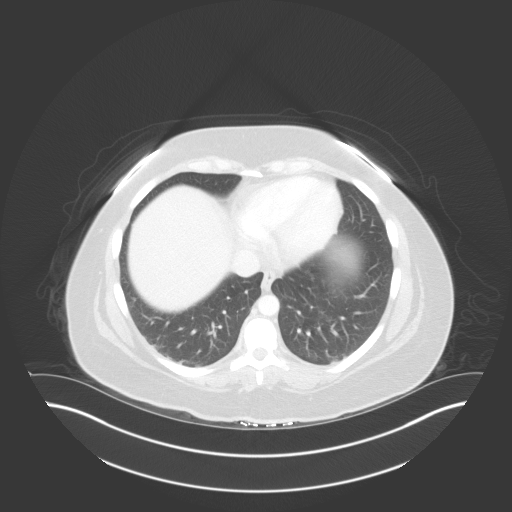

[Series 9: a/p w/ cor · coronal · 0.94mm/px · 3 of 164 slices shown]
[im 41/164  soft-tissue]
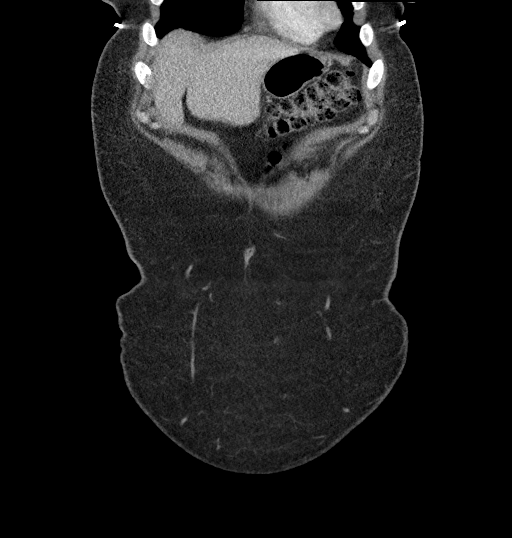
[im 82/164  soft-tissue]
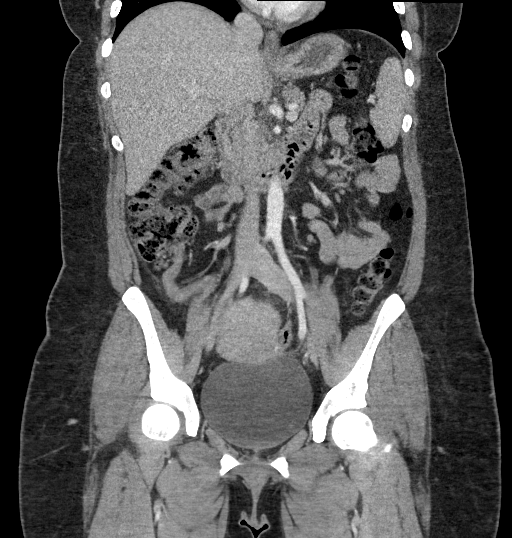
[im 123/164  soft-tissue]
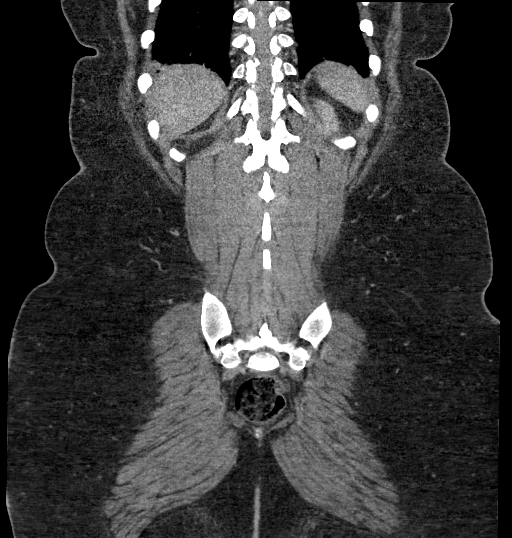

[14 of 46 positions shown; findings below may reference images not displayed]

FINDINGS: Lower chest: There is slight bibasilar atelectasis. Lung bases
otherwise are clear

Hepatobiliary: No focal liver lesions are appreciable. Gallbladder
wall is not appreciably thickened. There is no biliary duct
dilatation.

Pancreas: There is no appreciable pancreatic mass or inflammatory
focus.

Spleen: No splenic lesions are evident.

Adrenals/Urinary Tract: Adrenals bilaterally appear normal. Kidneys
bilaterally show no evident mass or hydronephrosis on either side.
There is no appreciable renal or ureteral calculus on either side.
Urinary bladder is midline with wall thickness within normal limits.

Stomach/Bowel: There is no appreciable bowel wall or mesenteric
thickening. There is no appreciable bowel obstruction. There is no
free air or portal venous air.

Vascular/Lymphatic: There is no abdominal aortic aneurysm. No
vascular lesions are evident. There is a circumaortic left renal
vein, an anatomic variant. There is no adenopathy in the abdomen or
pelvis.

Reproductive: The uterus is anteverted. There is a somewhat lobular
appearing area along the anterior lower uterine segment measuring
4.1 x 2.8 cm, a likely uterine leiomyoma. No extrauterine pelvic
mass evident.

Other: Measures 10 mm in thickness. There is slight enhancement of
the wall of the appendix. There is subtle soft tissue stranding at
the base of the appendix. There is no surrounding periappendiceal
fluid or abscess. No evident perforation in the appendiceal region.
There is no abscess or ascites in the abdomen or pelvis.

Musculoskeletal: There are no appreciable blastic or lytic bone
lesions. No intramuscular or abdominal wall lesions are evident.
IMPRESSION: 1. The appearance of the appendix suggests early acute appendiceal
inflammation.

Appendix: Location: Extending inferiorly from the cecum at the level
of the superior aspect of the iliac crest.

Diameter: 10 mm

Appendicolith: None

Mucosal hyper-enhancement: Mild

Extraluminal gas: None

Periappendiceal collection: None. Slight stranding in the mesentery
at the base of the appendix.

2.  No bowel obstruction.  No abscess in the abdomen or pelvis.

3.  No evident renal or ureteral calculus.  No hydronephrosis.

4. Probable leiomyoma arising in the anterior proximal to mid
uterus.

Critical Value/emergent results were called by telephone at the time
of interpretation on 09/06/2018 at [DATE] to Dr. Dilworth, Rajan physician
, who verbally acknowledged these results.

## 2020-06-11 ENCOUNTER — Other Ambulatory Visit: Payer: Self-pay

## 2020-06-11 ENCOUNTER — Emergency Department (HOSPITAL_COMMUNITY)
Admission: EM | Admit: 2020-06-11 | Discharge: 2020-06-11 | Disposition: A | Discharge status: Home / Self Care | Payer: Self-pay | Attending: Emergency Medicine | Admitting: Emergency Medicine

## 2020-06-11 ENCOUNTER — Emergency Department (HOSPITAL_COMMUNITY): Payer: Self-pay

## 2020-06-11 DIAGNOSIS — R079 Chest pain, unspecified: Secondary | ICD-10-CM | POA: Insufficient documentation

## 2020-06-11 LAB — CBC
HCT: 45.2 % (ref 36.0–46.0)
Hemoglobin: 14.7 g/dL (ref 12.0–15.0)
MCH: 28.8 pg (ref 26.0–34.0)
MCHC: 32.5 g/dL (ref 30.0–36.0)
MCV: 88.5 fL (ref 80.0–100.0)
Platelets: 404 10*3/uL — ABNORMAL HIGH (ref 150–400)
RBC: 5.11 MIL/uL (ref 3.87–5.11)
RDW: 12.4 % (ref 11.5–15.5)
WBC: 8.5 10*3/uL (ref 4.0–10.5)
nRBC: 0 % (ref 0.0–0.2)

## 2020-06-11 LAB — BASIC METABOLIC PANEL
Anion gap: 12 (ref 5–15)
BUN: 15 mg/dL (ref 6–20)
CO2: 23 mmol/L (ref 22–32)
Calcium: 9.8 mg/dL (ref 8.9–10.3)
Chloride: 105 mmol/L (ref 98–111)
Creatinine, Ser: 0.64 mg/dL (ref 0.44–1.00)
GFR, Estimated: >60 mL/min (ref >60)
Glucose, Bld: 95 mg/dL (ref 70–99)
Potassium: 3.7 mmol/L (ref 3.5–5.1)
Sodium: 140 mmol/L (ref 135–145)

## 2020-06-11 LAB — I-STAT BETA HCG BLOOD, ED (MC, WL, AP ONLY): I-stat hCG, quantitative: <5 m[IU]/mL (ref <5)

## 2020-06-11 LAB — TROPONIN I (HIGH SENSITIVITY)
Troponin I (High Sensitivity): <2 ng/L (ref <18)
Troponin I (High Sensitivity): <2 ng/L (ref <18)

## 2020-06-11 LAB — D-DIMER, QUANTITATIVE: D-Dimer, Quant: 0.4 ug/mL-FEU (ref 0.00–0.50)

## 2020-06-11 MED ORDER — HYDROXYZINE HCL 25 MG PO TABS: 25.0000 mg | ORAL_TABLET | Freq: Four times a day (QID) | ORAL | 0 refills | Status: DC

## 2020-06-11 MED ORDER — ACETAMINOPHEN 500 MG PO TABS
1000.0000 mg | ORAL_TABLET | Freq: Once | ORAL | Status: AC
  Administered 2020-06-11: 1000 mg via ORAL
  Filled 2020-06-11: qty 2

## 2020-06-11 NOTE — ED Triage Notes (Signed)
C/O sudden onset of chest tightness while sitting down at work, followed by severe headache. Patient endorsed currently taking ABX and OTC pain meds ,

## 2020-06-11 NOTE — ED Notes (Signed)
E-signature pad unavailable at time of pt discharge. This RN discussed discharge materials with pt and answered all pt questions. Pt stated understanding of discharge material. ? ?

## 2020-06-11 NOTE — ED Provider Notes (Signed)
[facility] EMERGENCY DEPARTMENT Provider Note   CSN: [id number] Arrival date & time: 06/11/20  1425     History Chief Complaint  Patient presents with  . Chest Pain    Kelly Haley is a 39 y.o. female.  HPI Patient is a 39 year old female with no past medical history presented today with right-sided chest pain that came on abruptly at approximately 9:30 AM this morning lasted for less than 1 minute and resolved without intervention.  She states it is nonradiating, nonpleuritic and states that she did not exert herself while the episode occurred.  She states after his 1 minute episode her chest pain went away completely and has not recurred.  She states that she did feel somewhat short of breath during the episode but no longer does.  She states that she knows of bilateral palm sweating and hand tingling and felt very anxious.  She states she has no history of anxiety she states she has a primary care doctor however.  She denies any alcohol or drug use.  Denies any other associated symptoms including fevers or chills cough or congestion or myalgias.  Denies any aggravating or mitigating factors.     Past Medical History:  Diagnosis Date  . Medical history non-contributory     Patient Active Problem List   Diagnosis Date Noted  . Appendicitis 09/06/2018    Past Surgical History:  Procedure Laterality Date  . APPENDECTOMY  09/06/2018  . CARPAL TUNNEL RELEASE    . CARPAL TUNNEL RELEASE    . LAPAROSCOPIC APPENDECTOMY N/A 09/06/2018   Procedure: APPENDECTOMY LAPAROSCOPIC;  Surgeon: Axel Filler, MD;  Location: [facility] OR;  Service: General;  Laterality: N/A;  . TUBAL LIGATION       OB History    Gravida  4   Para  3   Term  3   Preterm      AB  1   Living  3     SAB      TAB  1   Ectopic      Multiple      Live Births              No family history on file.  Social History   Tobacco Use  . Smoking status: Never Smoker  .  Smokeless tobacco: Never Used  Vaping Use  . Vaping Use: Never used  Substance Use Topics  . Alcohol use: No  . Drug use: No    Home Medications Prior to Admission medications   Medication Sig Start Date End Date Taking? Authorizing Provider  acetaminophen (TYLENOL) 500 MG tablet Take 2 tablets (1,000 mg total) by mouth every 6 (six) hours as needed for mild pain (or temp > 100). 09/07/18  Yes Barnetta Chapel, PA-C  hydrOXYzine (ATARAX/VISTARIL) 25 MG tablet Take 1 tablet (25 mg total) by mouth every 6 (six) hours. 06/11/20   Gailen Shelter, PA    Allergies    Penicillins  Review of Systems   Review of Systems  Constitutional: Positive for fatigue. Negative for chills and fever.  HENT: Negative for congestion.   Eyes: Negative for pain.  Respiratory: Positive for shortness of breath (resolved). Negative for cough.   Cardiovascular: Positive for chest pain (resolved). Negative for leg swelling.  Gastrointestinal: Negative for abdominal pain, diarrhea, nausea and vomiting.  Genitourinary: Negative for dysuria.  Musculoskeletal: Negative for myalgias.  Skin: Negative for rash.  Neurological: Negative for dizziness and headaches.    Physical Exam  Updated Vital Signs BP (!) 117/57   Pulse 81   Temp 98 F (36.7 C) (Oral)   Resp (!) 21   Ht 5\' 4"  (1.626 m)   Wt 97.5 kg   SpO2 98%   BMI 36.90 kg/m   Physical Exam Vitals and nursing note reviewed.  Constitutional:      General: She is not in acute distress.    Comments: Pleasant well-appearing 39 year old.  In no acute distress.  Sitting comfortably in bed.  Able answer questions appropriately follow commands. No increased work of breathing. Speaking in full sentences.  HENT:     Head: Normocephalic and atraumatic.     Nose: Nose normal.     Mouth/Throat:     Mouth: Mucous membranes are moist.  Eyes:     General: No scleral icterus. Cardiovascular:     Rate and Rhythm: Normal rate and regular rhythm.     Pulses:  Normal pulses.     Heart sounds: Normal heart sounds.     Comments: Bilateral radial artery pulses 3+ and symmetric. Pulmonary:     Effort: Pulmonary effort is normal. No respiratory distress.     Breath sounds: Normal breath sounds. No wheezing.  Abdominal:     Palpations: Abdomen is soft.     Tenderness: There is no abdominal tenderness. There is no guarding or rebound.  Musculoskeletal:     Cervical back: Normal range of motion.     Right lower leg: No edema.     Left lower leg: No edema.  Skin:    General: Skin is warm and dry.     Capillary Refill: Capillary refill takes less than 2 seconds.  Neurological:     Mental Status: She is alert. Mental status is at baseline.  Psychiatric:        Mood and Affect: Mood normal.        Behavior: Behavior normal.     ED Results / Procedures / Treatments   Labs (all labs ordered are listed, but only abnormal results are displayed) Labs Reviewed  CBC - Abnormal; Notable for the following components:      Result Value   Platelets 404 (*)    All other components within normal limits  BASIC METABOLIC PANEL  D-DIMER, QUANTITATIVE (NOT AT [facility])  I-STAT BETA HCG BLOOD, ED ([facility], [facility], [facility] ONLY)  TROPONIN I (HIGH SENSITIVITY)  TROPONIN I (HIGH SENSITIVITY)    EKG EKG Interpretation  Date/Time:  Wednesday June 11 2020 14:32:16 EST Ventricular Rate:  104 PR Interval:  136 QRS Duration: 84 QT Interval:  336 QTC Calculation: 441 R Axis:   92 Text Interpretation: Sinus tachycardia Rightward axis Cannot rule out Anterior infarct , age undetermined No significant change since last tracing Confirmed by [birth year]1970 (Gwyneth Sprout) on 06/11/2020 4:16:12 PM   Radiology DG Chest 2 View  Result Date: 06/11/2020 CLINICAL DATA:  Chest pain. EXAM: CHEST - 2 VIEW COMPARISON:  Number thirtieth 2013. FINDINGS: The heart size and mediastinal contours are within normal limits. Both lungs are clear. No pneumothorax or pleural effusion is noted. The  visualized skeletal structures are unremarkable. IMPRESSION: No active cardiopulmonary disease. Electronically Signed   By: 2014 M.D.   On: 06/11/2020 15:23    Procedures Procedures (including critical care time)  Medications Ordered in ED Medications  acetaminophen (TYLENOL) tablet 1,000 mg (1,000 mg Oral Given 06/11/20 1825)    ED Course  I have reviewed the triage vital signs and the nursing notes.  Pertinent labs & imaging results that were available during my care of the patient were reviewed by me and considered in my medical decision making (see chart for details).  Patient is 39 year old female with no pertinent past medical history presented today for sharp sudden onset chest pain that lasted just briefly.  She has some tachycardia and some bilateral hand tingling.  She also felt fatigued and "off"  Physical exam is relatively reassuring.  She did have a tachycardia of 102 initially this resolved spontaneously.  I have overall very low suspicion for pulmonary embolism however will obtain dimer.  She is already received delta troponins which were undetectable.  Is been several hours since her symptom onset and although she feels fatigued she has no chest pain shortness of breath currently.  Her vital signs are within normal limits she has no JVP I doubt tamponade or tension pneumothorax. Doubt myocarditis, ACS or pneumonia. Chronicity is not consistent with the above.  She has no significant risk factors is non-smoker and has no cardiac disease history.  Cbc without leukocytosis or anemia. BMP within normal months Troponin x2 within normal months I-STAT ECG negative D-dimer is negative Chest x-ray reviewed by myself is without any acute abnormality. EKG reviewed below.  Clinical Course as of Jun 12 32  Wed Jun 11, 2020  1801 EKG with mild sinus tachycardia, S1Q3T3 present however on my review of prior EKGs it is progressive in the past.  No significant changes  from prior.  No significant ST-T wave abnormalities.   [WF]    Clinical Course User Index [WF] Gailen Shelter, [place]   Patient given 1 dose of Tylenol states she feels somewhat improved on my reassessment.  Will discharge with conservative therapy at this time no cough fever or chills to indicate Covid.  Her symptom onset was abrupt very low suspicion for this.  MDM Rules/Calculators/A&P                          She will follow up with her primary care doctor return to the ER for any new or concerning symptoms.  We had lengthy educational conversation about return cautions and she was able to teach back and understands importance of reassessment if her symptoms should change or progress.  Given prescription for hydroxyzine given that patient does state that she feels she may have some anxiety.  Final Clinical Impression(s) / ED Diagnoses Final diagnoses:  Chest pain, unspecified type    Rx / DC Orders ED Discharge Orders         Ordered    hydrOXYzine (ATARAX/VISTARIL) 25 MG tablet  Every 6 hours        06/11/20 2103           Gailen Shelter, [place] 06/12/20 0034    Gwyneth Sprout, MD 06/13/20 2212

## 2020-06-11 NOTE — Discharge Instructions (Addendum)
Your lab work was reassuring today.  Your lab work was not indicative of a blood clot in your lung.  As your vital signs appear very normal and your work-up is reassuring I feel comfortable sending you home today knowing that your return to ER if you have any new or concerning symptoms.  Please drink plenty of water, take the next 2 days and also having you a work note.  Please return to the ER for any changes in your condition.  Otherwise please follow-up with your primary care doctor.  You do not have a primary care doctor please call to make an appointment for him.  If you do not have insurance is contact the [facility] whose information I have provided you with.  As anxiety can cause some of the symptoms I provided you hydroxyzine which is a second line is a medicine.  A primary care doctor may be able to prescribe a first-line medication such as an SSRI.

## 2020-06-24 ENCOUNTER — Encounter: Payer: Self-pay | Admitting: General Practice

## 2021-02-20 ENCOUNTER — Encounter (HOSPITAL_COMMUNITY): Payer: Self-pay | Admitting: *Deleted

## 2021-02-20 ENCOUNTER — Other Ambulatory Visit: Payer: Self-pay

## 2021-02-20 ENCOUNTER — Ambulatory Visit (HOSPITAL_COMMUNITY)
Admission: EM | Admit: 2021-02-20 | Discharge: 2021-02-20 | Disposition: A | Discharge status: Home / Self Care | Payer: Self-pay | Attending: Family Medicine | Admitting: Family Medicine

## 2021-02-20 DIAGNOSIS — K047 Periapical abscess without sinus: Secondary | ICD-10-CM

## 2021-02-20 MED ORDER — CLINDAMYCIN HCL 300 MG PO CAPS: 300.0000 mg | ORAL_CAPSULE | Freq: Two times a day (BID) | ORAL | 0 refills | Status: DC

## 2021-02-20 MED ORDER — KETOROLAC TROMETHAMINE 60 MG/2ML IM SOLN
60.0000 mg | Freq: Once | INTRAMUSCULAR | Status: AC
  Administered 2021-02-20: 60 mg via INTRAMUSCULAR

## 2021-02-20 MED ORDER — LIDOCAINE VISCOUS HCL 2 % MT SOLN: 10.0000 mL | OROMUCOSAL | 0 refills | Status: DC | PRN

## 2021-02-20 MED ORDER — KETOROLAC TROMETHAMINE 60 MG/2ML IM SOLN
INTRAMUSCULAR | Status: AC
  Filled 2021-02-20: qty 2

## 2021-02-20 NOTE — ED Triage Notes (Signed)
Pt reports Lt lower dental pain.

## 2021-02-20 NOTE — ED Provider Notes (Signed)
[facility]    CSN: [id number] Arrival date & time: 02/20/21  1929      History   Chief Complaint Chief Complaint  Patient presents with   Dental Pain    HPI Kelly Haley is a 40 y.o. female.   Patient presenting today with acute on chronic left lower dental pain and a significantly broken and decaying molar that she has had numerous issues with in the past.  She states she always has pain in the area, but today it became excruciating and she is having facial swelling, swelling to the area.  Denies fevers, chills, dysphagia, difficulty breathing, drainage to the mouth.  Over-the-counter pain relievers not helping.  Does not have a dentist.   Past Medical History:  Diagnosis Date   Medical history non-contributory     Patient Active Problem List   Diagnosis Date Noted   Appendicitis 09/06/2018    Past Surgical History:  Procedure Laterality Date   APPENDECTOMY  09/06/2018   CARPAL TUNNEL RELEASE     CARPAL TUNNEL RELEASE     LAPAROSCOPIC APPENDECTOMY N/A 09/06/2018   Procedure: APPENDECTOMY LAPAROSCOPIC;  Surgeon: Axel Filler, MD;  Location: [facility];  Service: General;  Laterality: N/A;   TUBAL LIGATION      OB History     Gravida  4   Para  3   Term  3   Preterm      AB  1   Living  3      SAB      IAB  1   Ectopic      Multiple      Live Births               Home Medications    Prior to Admission medications   Medication Sig Start Date End Date Taking? Authorizing Provider  clindamycin (CLEOCIN) 300 MG capsule Take 1 capsule (300 mg total) by mouth 2 (two) times daily. 02/20/21  Yes Particia Nearing, PA-C  lidocaine (XYLOCAINE) 2 % solution Use as directed 10 mLs in the mouth or throat as needed for mouth pain. 02/20/21  Yes Particia Nearing, PA-C  acetaminophen (TYLENOL) 500 MG tablet Take 2 tablets (1,000 mg total) by mouth every 6 (six) hours as needed for mild pain (or temp > 100). 09/07/18   Barnetta Chapel,  PA-C  hydrOXYzine (ATARAX/VISTARIL) 25 MG tablet Take 1 tablet (25 mg total) by mouth every 6 (six) hours. 06/11/20   Gailen Shelter, PA    Family History History reviewed. No pertinent family history.  Social History Social History   Tobacco Use   Smoking status: Never   Smokeless tobacco: Never  Vaping Use   Vaping Use: Never used  Substance Use Topics   Alcohol use: No   Drug use: No     Allergies   Penicillins   Review of Systems Review of Systems Per HPI  Physical Exam Triage Vital Signs ED Triage Vitals  Enc Vitals Group     BP 02/20/21 1949 (!) 141/89     Pulse Rate 02/20/21 1949 75     Resp 02/20/21 1949 18     Temp 02/20/21 1949 98.1 F (36.7 C)     Temp src --      SpO2 02/20/21 1949 100 %     Weight --      Height --      Head Circumference --      Peak Flow --  Pain Score 02/20/21 1950 10     Pain Loc --      Pain Edu? --      Excl. in GC? --    No data found.  Updated Vital Signs BP (!) 141/89   Pulse 75   Temp 98.1 F (36.7 C)   Resp 18   LMP 02/20/2021   SpO2 100%   Visual Acuity Right Eye Distance:   Left Eye Distance:   Bilateral Distance:    Right Eye Near:   Left Eye Near:    Bilateral Near:     Physical Exam Vitals and nursing note reviewed.  Constitutional:      Appearance: Normal appearance. She is not ill-appearing.  HENT:     Head: Atraumatic.     Mouth/Throat:     Mouth: Mucous membranes are moist.     Pharynx: Posterior oropharyngeal erythema present.     Comments: Significant decay of left posterior molar lower jaw with significant gingival edema and erythema.  No abscess obvious.  Facial swelling in this area Eyes:     Extraocular Movements: Extraocular movements intact.     Conjunctiva/sclera: Conjunctivae normal.  Cardiovascular:     Rate and Rhythm: Normal rate and regular rhythm.     Heart sounds: Normal heart sounds.  Pulmonary:     Effort: Pulmonary effort is normal.     Breath sounds:  Normal breath sounds.  Musculoskeletal:        General: Normal range of motion.     Cervical back: Normal range of motion and neck supple.  Skin:    General: Skin is warm and dry.  Neurological:     Mental Status: She is alert and oriented to person, place, and time.  Psychiatric:        Mood and Affect: Mood normal.        Thought Content: Thought content normal.        Judgment: Judgment normal.     UC Treatments / Results  Labs (all labs ordered are listed, but only abnormal results are displayed) Labs Reviewed - No data to display  EKG   Radiology No results found.  Procedures Procedures (including critical care time)  Medications Ordered in UC Medications  ketorolac (TORADOL) injection 60 mg (60 mg Intramuscular Given 02/20/21 2010)    Initial Impression / Assessment and Plan / UC Course  I have reviewed the triage vital signs and the nursing notes.  Pertinent labs & imaging results that were available during my care of the patient were reviewed by me and considered in my medical decision making (see chart for details).     Will treat with clindamycin given PCN allergy, viscous lidocaine.  IM Toradol given in clinic for pain.  Discussed continued over-the-counter pain relievers, salt water gargles, close dental follow-up as soon as possible.  Return for acutely worsening symptoms.  Final Clinical Impressions(s) / UC Diagnoses   Final diagnoses:  Dental infection   Discharge Instructions   None    ED Prescriptions     Medication Sig Dispense Auth. Provider   clindamycin (CLEOCIN) 300 MG capsule Take 1 capsule (300 mg total) by mouth 2 (two) times daily. 14 capsule Particia Nearing, PA-C   lidocaine (XYLOCAINE) 2 % solution Use as directed 10 mLs in the mouth or throat as needed for mouth pain. 100 mL Particia Nearing, [place]      PDMP not reviewed this encounter.   Particia Nearing, [place] 02/20/21 2033

## 2021-05-25 ENCOUNTER — Other Ambulatory Visit: Payer: Self-pay

## 2021-05-25 DIAGNOSIS — N6310 Unspecified lump in the right breast, unspecified quadrant: Secondary | ICD-10-CM

## 2021-06-04 ENCOUNTER — Other Ambulatory Visit: Payer: Self-pay

## 2021-06-04 ENCOUNTER — Ambulatory Visit
Admission: RE | Admit: 2021-06-04 | Discharge: 2021-06-04 | Disposition: A | Discharge status: Home / Self Care | Payer: No Typology Code available for payment source | Source: Ambulatory Visit | Attending: Obstetrics and Gynecology | Admitting: Obstetrics and Gynecology

## 2021-06-04 ENCOUNTER — Ambulatory Visit: Payer: Self-pay | Admitting: *Deleted

## 2021-06-04 ENCOUNTER — Ambulatory Visit
Admission: RE | Admit: 2021-06-04 | Discharge: 2021-06-04 | Disposition: A | Discharge status: Home / Self Care | Payer: Self-pay | Source: Ambulatory Visit | Attending: Obstetrics and Gynecology | Admitting: Obstetrics and Gynecology

## 2021-06-04 VITALS — BP 140/86

## 2021-06-04 DIAGNOSIS — N644 Mastodynia: Secondary | ICD-10-CM

## 2021-06-04 DIAGNOSIS — Z1239 Encounter for other screening for malignant neoplasm of breast: Secondary | ICD-10-CM

## 2021-06-04 DIAGNOSIS — N6310 Unspecified lump in the right breast, unspecified quadrant: Secondary | ICD-10-CM

## 2021-06-04 NOTE — Patient Instructions (Signed)
Explained breast self awareness with Ernestene Mention. Patient did not need a Pap smear today due to last Pap smear and HPV typing was in November 2022 per patient. Let her know BCCCP will cover Pap smears and HPV typing every 5 years unless has a history of abnormal Pap smears. Referred patient to the [facility] for a diagnostic mammogram. Appointment scheduled Thursday, June 04, 2021 at 1400. Patient aware of appointment and will be there. Ernestene Mention verbalized understanding.  Sakshi Sermons, Kathaleen Maser, RN 9:55 AM

## 2021-06-04 NOTE — Progress Notes (Signed)
[name] is a 40 y.o. female who presents to [facility] clinic today with complaint of right breast lump x around 2 months that patient stated her provider palpated on exam 2 weeks ago. Patient stated the right breast lump is tender to the touch. Patient rates the pain at a 3 out of 10. .    Pap Smear: Pap smear not completed today. Last Pap smear was in November 2022 at the [facility] clinic and was normal with negative HPV per patient. Per patient has no history of an abnormal Pap smear. Last Pap smear result is not available in Epic.   Physical exam: Breasts Breasts symmetrical. No skin abnormalities bilateral breasts. No nipple retraction bilateral breasts. No nipple discharge bilateral breasts. No lymphadenopathy. No lumps palpated bilateral breasts. Unable to palpate a lump in patients area of concern on exam. Patient complained of focal pain on exam at 4 o'clock between 3-5 cm from nipple.      Pelvic/Bimanual Pap is not indicated today per BCCCP guidelines.   Smoking History: Patient is a former smoker that quit 15 years ago.   Patient Navigation: Patient education provided. Access to services provided for patient through BCCCP program.   Breast and Cervical Cancer Risk Assessment: Patient does not have family history of breast cancer, known genetic mutations, or radiation treatment to the chest before age 62. Patient does not have history of cervical dysplasia, immunocompromised, or DES exposure in-utero.  Risk Assessment     Risk Scores       06/04/2021   Last edited by: Meryl Dare, CMA   5-year risk: 0.5 %   Lifetime risk: 7.2 %           A: BCCCP exam without pap smear Complaint of right breast lump and pain.  P: Referred patient to the [facility] for a diagnostic mammogram. Appointment scheduled Thursday, June 04, 2021 at 1400.  Priscille Heidelberg, RN 06/04/2021 9:55 AM

## 2021-09-01 ENCOUNTER — Encounter (HOSPITAL_COMMUNITY): Payer: Self-pay | Admitting: Emergency Medicine

## 2021-09-01 ENCOUNTER — Other Ambulatory Visit: Payer: Self-pay

## 2021-09-01 ENCOUNTER — Emergency Department (HOSPITAL_COMMUNITY)
Admission: EM | Admit: 2021-09-01 | Discharge: 2021-09-02 | Disposition: A | Discharge status: Home / Self Care | Payer: No Typology Code available for payment source | Attending: Emergency Medicine | Admitting: Emergency Medicine

## 2021-09-01 ENCOUNTER — Emergency Department (HOSPITAL_COMMUNITY): Payer: No Typology Code available for payment source

## 2021-09-01 DIAGNOSIS — R55 Syncope and collapse: Secondary | ICD-10-CM | POA: Insufficient documentation

## 2021-09-01 DIAGNOSIS — R079 Chest pain, unspecified: Secondary | ICD-10-CM | POA: Insufficient documentation

## 2021-09-01 DIAGNOSIS — E876 Hypokalemia: Secondary | ICD-10-CM | POA: Insufficient documentation

## 2021-09-01 DIAGNOSIS — I10 Essential (primary) hypertension: Secondary | ICD-10-CM | POA: Insufficient documentation

## 2021-09-01 DIAGNOSIS — E119 Type 2 diabetes mellitus without complications: Secondary | ICD-10-CM | POA: Insufficient documentation

## 2021-09-01 LAB — CBC
HCT: 43.2 % (ref 36.0–46.0)
Hemoglobin: 13.9 g/dL (ref 12.0–15.0)
MCH: 27.3 pg (ref 26.0–34.0)
MCHC: 32.2 g/dL (ref 30.0–36.0)
MCV: 84.7 fL (ref 80.0–100.0)
Platelets: 371 10*3/uL (ref 150–400)
RBC: 5.1 MIL/uL (ref 3.87–5.11)
RDW: 13 % (ref 11.5–15.5)
WBC: 6.9 10*3/uL (ref 4.0–10.5)
nRBC: 0 % (ref 0.0–0.2)

## 2021-09-01 LAB — BASIC METABOLIC PANEL
Anion gap: 11 (ref 5–15)
BUN: 10 mg/dL (ref 6–20)
CO2: 24 mmol/L (ref 22–32)
Calcium: 9.5 mg/dL (ref 8.9–10.3)
Chloride: 106 mmol/L (ref 98–111)
Creatinine, Ser: 0.78 mg/dL (ref 0.44–1.00)
GFR, Estimated: >60 mL/min (ref >60)
Glucose, Bld: 95 mg/dL (ref 70–99)
Potassium: 3.2 mmol/L — ABNORMAL LOW (ref 3.5–5.1)
Sodium: 141 mmol/L (ref 135–145)

## 2021-09-01 LAB — TROPONIN I (HIGH SENSITIVITY)
Troponin I (High Sensitivity): 3 ng/L (ref <18)
Troponin I (High Sensitivity): 2 ng/L (ref <18)

## 2021-09-01 NOTE — ED Triage Notes (Signed)
Pt c/o central chest pain x 3 days, denies shortness of breath nausea/vomiting. Reports today she felt lightheaded and had a near syncopal episode x 2.

## 2021-09-01 NOTE — ED Provider Triage Note (Signed)
Emergency Medicine Provider Triage Evaluation Note  Kelly Haley , a 41 y.o. female  was evaluated in triage.  Pt complains of chest pain.  Been intermittent, substernal.  Unable to characterize it, no nausea or vomiting or shortness of breath.  Does not appear to radiate elsewhere..  Review of Systems  Positive: Chest pain Negative: syncope  Physical Exam  There were no vitals taken for this visit. Gen:   Awake, no distress   Resp:  Normal effort  MSK:   Moves extremities without difficulty  Other:  Lungs are clear to auscultation bilaterally, patient is slightly tachycardic.  Medical Decision Making  Medically screening exam initiated at 7:14 PM.  Appropriate orders placed.  ANAPAOLA KINSEL was informed that the remainder of the evaluation will be completed by another provider, this initial triage assessment does not replace that evaluation, and the importance of remaining in the ED until their evaluation is complete.     Theron Arista, PA-C 09/01/21 1914

## 2021-09-02 MED ORDER — POTASSIUM CHLORIDE CRYS ER 20 MEQ PO TBCR
40.0000 meq | EXTENDED_RELEASE_TABLET | Freq: Once | ORAL | Status: AC
  Administered 2021-09-02: 40 meq via ORAL
  Filled 2021-09-02: qty 2

## 2021-09-02 MED ORDER — POTASSIUM CHLORIDE CRYS ER 20 MEQ PO TBCR: 20.0000 meq | EXTENDED_RELEASE_TABLET | Freq: Two times a day (BID) | ORAL | 0 refills | Status: DC

## 2021-09-02 NOTE — Progress Notes (Signed)
Cardiology Office Note:    Date:  09/04/2021   ID:  Kelly Haley, DOB [birth year], MRN [id number]  PCP:  Department, [facility] Providers Cardiologist:  Christell Constant, MD     Referring MD: Dione Booze, MD   CC: Chest pain Consulted for the evaluation of chest pain and near syncope at the behest of Department, [facility]  History of Present Illness:    Kelly Haley is a 41 y.o. female with a hx of morbid obesity Heart murmur  who presents for evaluation.  Patient notes that a lot has changed since she had a sudden onset popping in her Right ear (2016) ever since she has been able to hear her heart beat in her ears.  She notes that this may be linked to the issues she has been dealing with.  She has noted persistent head heaviness and pressure.  She notes that she has had MRI and CT scans of her head and neck, and seen [facility] and audiology but no one can figure out what is going on.  Notes that she has spells with intermitting heart racing.  She knows that this can be associated with dizziness.  She sometimes feels dizzy independent of this.  Feels dizzy headed now. Notes that sometimes have vertiginous symptoms.  She has had two recent events that lead to EF visits: 09/01/21 was sitting with her boyfriend began to feel hot in her face and clammy.  She felt heart racing and central non radiating chest pain. Noted nausea or dizziness.These symptoms came and when several time and lead to ED visit.  They did not find any cardiac issues on evaluation.  She is un-insured as PCP is the [facility].  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   Past Medical History:  Diagnosis Date   Appendicitis    Chest pain    Dental infection    Flank pain    Headache    Hearing loss    Hypokalemia    Lumbar pain    Mass of right breast    Medical history non-contributory    Muscle strain    Near syncope     Pharyngitis    Pulsatile tinnitus of right ear    Sensorineural hearing loss    Urinary frequency    UTI (urinary tract infection)    Weakness     Past Surgical History:  Procedure Laterality Date   APPENDECTOMY  09/06/2018   CARPAL TUNNEL RELEASE     CARPAL TUNNEL RELEASE     LAPAROSCOPIC APPENDECTOMY N/A 09/06/2018   Procedure: APPENDECTOMY LAPAROSCOPIC;  Surgeon: Axel Filler, MD;  Location: [facility] OR;  Service: General;  Laterality: N/A;   TUBAL LIGATION      Current Medications: Current Meds  Medication Sig   busPIRone (BUSPAR) 10 MG tablet Take 10 mg by mouth daily.   ibuprofen (ADVIL) 200 MG tablet Take 200 mg by mouth as needed.   potassium chloride SA (KLOR-CON M) 20 MEQ tablet Take 1 tablet (20 mEq total) by mouth 2 (two) times daily.     Allergies:   Penicillins   Social History   Socioeconomic History   Marital status: Single    Spouse name: Not on file   Number of children: Not on file   Years of education: Not on file   Highest education level: Not on file  Occupational History   Not on  file  Tobacco Use   Smoking status: Never   Smokeless tobacco: Never  Vaping Use   Vaping Use: Never used  Substance and Sexual Activity   Alcohol use: Yes    Comment: occ.   Drug use: No   Sexual activity: Yes    Birth control/protection: Surgical  Other Topics Concern   Not on file  Social History Narrative   Not on file   Social Determinants of Health   Financial Resource Strain: Not on file  Food Insecurity: No Food Insecurity   Worried About Running Out of Food in the Last Year: Never true   Ran Out of Food in the Last Year: Never true  Transportation Needs: No Transportation Needs   Lack of Transportation (Medical): No   Lack of Transportation (Non-Medical): No  Physical Activity: Not on file  Stress: Not on file  Social Connections: Not on file     Family History: The patient's family history includes Colon cancer in her father.  ROS:    Please see the history of present illness.     All other systems reviewed and are negative.  EKGs/Labs/Other Studies Reviewed:    The following studies were reviewed today:  EKG:   09/02/21: SR with sinus arrhythmia rate 96 non-specific TWI  Recent Labs: 09/01/2021: BUN 10; Creatinine, Ser 0.78; Hemoglobin 13.9; Platelets 371; Potassium 3.2; Sodium 141  Recent Lipid Panel No results found for: CHOL, TRIG, HDL, CHOLHDL, VLDL, LDLCALC, LDLDIRECT   Physical Exam:    VS:  BP 127/83    Pulse 90    Ht 5\' 4"  (1.626 m)    Wt 115.2 kg    SpO2 98%    BMI 43.60 kg/m     Wt Readings from Last 3 Encounters:  09/04/21 115.2 kg  06/11/20 97.5 kg  09/06/18 95.7 kg     Gen: No distress, morbid obesity Neck: No JVD,  carotid bruit Cardiac: No Rubs or Gallops, no Murmur, RRR +2radial pulses Respiratory: Clear to auscultation bilaterally, normal effort, normal  respiratory rate GI: Soft, nontender, non-distended  MS: No  edema;  moves all extremities Integument: Skin feels warm Neuro:  At time of evaluation, alert and oriented to person/place/time/situation  Psych: Anxious affect, patient feels anxious   ASSESSMENT:    1. Near syncope   2. Chest pain of uncertain etiology   3. Palpitations    PLAN:    Near Syncope Palpitations Chest Pain SDH- Uninsured Vagal prodrome Morbid obesity - Overall her sequale does not seem cardiac but she notes a fairly robust testing prior without any cause for her discomfort - Has frequent palpitation symptoms- getting 14 day non live ziopatch to make sure they are non -arrhythmogenic - Supporting patient in getting the [facility] card, she will let us know where paperwork is through and we will get an echocardiogram - if persistent symptoms with negative testing about will get CCTA      Medication Adjustments/Labs and Tests Ordered: Current medicines are reviewed at length with the patient today.  Concerns regarding medicines are outlined above.   Orders Placed This Encounter  Procedures   LONG TERM MONITOR (3-14 DAYS)   No orders of the defined types were placed in this encounter.   Patient Instructions  Medication Instructions:  Your physician recommends that you continue on your current medications as directed. Please refer to the Current Medication list given to you today.  *If you need a refill on your cardiac medications before your next appointment, please  call your pharmacy*   Lab Work: NONE If you have labs (blood work) drawn today and your tests are completely normal, you will receive your results only by: [facility] (if you have MyChart) OR A paper copy in the mail If you have any lab test that is abnormal or we need to change your treatment, we will call you to review the results.   Testing/Procedures: Your physician has requested that you wear a 14 day heart monitor.    Follow-Up: At [facility], you and your health needs are our priority.  As part of our continuing mission to provide you with exceptional heart care, we have created designated Provider Care Teams.  These Care Teams include your primary Cardiologist (physician) and Advanced Practice Providers (APPs -  Physician Assistants and Nurse Practitioners) who all work together to provide you with the care you need, when you need it.    Your next appointment:   5 - 6 month(s)  The format for your next appointment:   In Person  Provider:       Other Instructions Please let our office know when you have insurance   [place] Monitor Instructions  Your physician has requested you wear a ZIO patch monitor for 14 days.  This is a single patch monitor. [employer] supplies one patch monitor per enrollment. Additional stickers are not available. Please do not apply patch if you will be having a Nuclear Stress Test,  Echocardiogram, Cardiac CT, MRI, or Chest Xray during the period you would be wearing the  monitor. The patch cannot be  worn during these tests. You cannot remove and re-apply the  ZIO XT patch monitor.  Your ZIO patch monitor will be mailed 3 day USPS to your address on file. It may take 3-5 days  to receive your monitor after you have been enrolled.  Once you have received your monitor, please review the enclosed instructions. Your monitor  has already been registered assigning a specific monitor serial # to you.  Billing and Patient Assistance Program Information  We have supplied [employer] with any of your insurance information on file for billing purposes. [employer] offers a sliding scale Patient Assistance Program for patients that do not have  insurance, or whose insurance does not completely cover the cost of the ZIO monitor.  You must apply for the Patient Assistance Program to qualify for this discounted rate.  To apply, please call [employer] at [phone], select option 4, select option 2, ask to apply for  Patient Assistance Program. Theodore Demark will ask your household income, and how many people  are in your household. They will quote your out-of-pocket cost based on that information.  [employer] will also be able to set up a 7-month, interest-free payment plan if needed.  Applying the monitor   Shave hair from upper left chest.  Hold abrader disc by orange tab. Rub abrader in 40 strokes over the upper left chest as  indicated in your monitor instructions.  Clean area with 4 enclosed alcohol pads. Let dry.  Apply patch as indicated in monitor instructions. Patch will be placed under collarbone on left  side of chest with arrow pointing upward.  Rub patch adhesive wings for 2 minutes. Remove white label marked "1". Remove the white  label marked "2". Rub patch adhesive wings for 2 additional minutes.  While looking in a mirror, press and release button in center of patch. A small green light will  flash 3-4 times. This will  be your only indicator that the monitor has been turned on.  Do not shower for  the first 24 hours. You may shower after the first 24 hours.  Press the button if you feel a symptom. You will hear a small click. Record Date, Time and  Symptom in the Patient Logbook.  When you are ready to remove the patch, follow instructions on the last 2 pages of Patient  Logbook. Stick patch monitor onto the last page of Patient Logbook.  Place Patient Logbook in the blue and white box. Use locking tab on box and tape box closed  securely. The blue and white box has prepaid postage on it. Please place it in the mailbox as  soon as possible. Your physician should have your test results approximately 7 days after the  monitor has been mailed back to [facility].  Call [facility] at [phone] if you have questions regarding  your ZIO XT patch monitor. Call them immediately if you see an orange light blinking on your  monitor.  If your monitor falls off in less than 4 days, contact our Monitor department at [phone].  If your monitor becomes loose or falls off after 4 days call [employer] at [phone] for  suggestions on securing your monitor     Signed, Werner Lean, MD  09/04/2021 5:13 PM    [place]

## 2021-09-02 NOTE — ED Provider Notes (Signed)
[facility] EMERGENCY DEPARTMENT Provider Note   CSN: [id number] Arrival date & time: 09/01/21  1905     History  Chief Complaint  Patient presents with   Chest Pain    Kelly Haley is a 41 y.o. female.  The history is provided by the patient.  Chest Pain She has no significant past history and comes in because of 2 episodes of near syncope today.  Both occurred while sitting.  She got a hot, flushed feeling in her face and her hands got clammy and she got lightheaded.  There was associated sense of her heart racing.  She was observed to get very pale.  Symptoms lasted for an estimated 5 minutes before resolving.  She has also been having intermittent episodes of a tight feeling in her chest.  This has been going on for several days.  Episodes come without any particular pattern and will last a few minutes before resolving.  There is no associated dyspnea, nausea, diaphoresis.  Symptoms or not related to eating, body position, exertion.  She is a non-smoker and denies history of diabetes or hypertension or hyperlipidemia and there is no family history of premature coronary atherosclerosis.  She has no recent travel history and there is no recent surgery and she is not on any exogenous estrogens.   Home Medications Prior to Admission medications   Medication Sig Start Date End Date Taking? Authorizing Provider  acetaminophen (TYLENOL) 500 MG tablet Take 2 tablets (1,000 mg total) by mouth every 6 (six) hours as needed for mild pain (or temp > 100). Patient not taking: Reported on 06/04/2021 09/07/18   Barnetta Chapel, PA-C  clindamycin (CLEOCIN) 300 MG capsule Take 1 capsule (300 mg total) by mouth 2 (two) times daily. Patient not taking: Reported on 06/04/2021 02/20/21   Particia Nearing, PA-C  hydrOXYzine (ATARAX/VISTARIL) 25 MG tablet Take 1 tablet (25 mg total) by mouth every 6 (six) hours. Patient not taking: Reported on 06/04/2021 06/11/20   Gailen Shelter, PA  lidocaine (XYLOCAINE) 2 % solution Use as directed 10 mLs in the mouth or throat as needed for mouth pain. Patient not taking: Reported on 06/04/2021 02/20/21   Particia Nearing, PA-C      Allergies    Penicillins    Review of Systems   Review of Systems  Cardiovascular:  Positive for chest pain.  All other systems reviewed and are negative.  Physical Exam Updated Vital Signs BP 111/63    Pulse 78    Temp 98 F (36.7 C) (Oral)    Resp 16    SpO2 99%  Physical Exam Vitals and nursing note reviewed.  41 year old female, resting comfortably and in no acute distress. Vital signs are normal. Oxygen saturation is 99%, which is normal. Head is normocephalic and atraumatic. PERRLA, EOMI. Oropharynx is clear. Neck is nontender and supple without adenopathy or JVD.  There are no carotid bruits. Back is nontender and there is no CVA tenderness. Lungs are clear without rales, wheezes, or rhonchi. Chest is nontender. Heart has regular rate and rhythm without murmur. Abdomen is soft, flat, nontender. Extremities have no cyanosis or edema, full range of motion is present. Skin is warm and dry without rash. Neurologic: Mental status is normal, cranial nerves are intact, moves all extremities equally.  ED Results / Procedures / Treatments   Labs (all labs ordered are listed, but only abnormal results are displayed) Labs Reviewed  BASIC METABOLIC PANEL - Abnormal;  Notable for the following components:      Result Value   Potassium 3.2 (*)    All other components within normal limits  CBC  I-STAT BETA HCG BLOOD, ED ([facility], [facility], [facility] ONLY)  TROPONIN I (HIGH SENSITIVITY)  TROPONIN I (HIGH SENSITIVITY)    EKG EKG Interpretation  Date/Time:  Tuesday September 01 2021 19:20:14 EST Ventricular Rate:  96 PR Interval:  134 QRS Duration: 84 QT Interval:  346 QTC Calculation: 437 R Axis:   41 Text Interpretation: Normal sinus rhythm with sinus arrhythmia Cannot rule out Anterior infarct  , age undetermined ST & T wave abnormality, consider inferior ischemia Abnormal ECG When compared with ECG of 11-Jun-2020 14:32, No significant change was found Confirmed by Dione Booze ([id number]) on 09/01/2021 11:41:38 PM  Radiology DG Chest 2 View  Result Date: 09/01/2021 CLINICAL DATA:  Provided history: Chest pain. Additional history provided: Patient reports mid to right chest pain, dizziness, neck pain, bilateral arm tingling, jaw tightness, increased blood pressure. EXAM: CHEST - 2 VIEW COMPARISON:  Prior chest radiographs 06/11/2020 and earlier. FINDINGS: Heart size within normal limits. No appreciable airspace consolidation. No evidence of pleural effusion or pneumothorax. No acute bony abnormality identified. Degenerative changes of the spine. IMPRESSION: No evidence of active cardiopulmonary disease. Electronically Signed   By: Jackey Loge D.O.   On: 09/01/2021 20:01    Procedures Procedures  Cardiac monitor, per my interpretation, shows normal sinus rhythm.  Medications Ordered in ED Medications  potassium chloride SA (KLOR-CON M) CR tablet 40 mEq (40 mEq Oral Given 09/02/21 0056)    ED Course/ Medical Decision Making/ A&P                           Medical Decision Making  Episodes of near syncope of uncertain cause.  Symptoms are not typical of vasovagal episode, she does have some palpitations concerning for possible arrhythmia such as PSVT or paroxysmal atrial fibrillation.  Cause of chest pain is also unclear.  Symptoms are atypical for ACS and she has no risk factors for heart disease, heart score is 0.  ECG shows some ST and T changes which had been present on a prior ECG, troponin is normal x2.  Chest x-ray is normal.  I have independently viewed the images, and agree with the radiologist's interpretation.  Labs are significant only for mild hypokalemia and she is given a dose of oral potassium.  At this point, I think the most important thing for her is to have a cardiac monitor  done as an outpatient.  She is discharged with ambulatory referral to cardiology to arrange for this.  She is also given prescription for K-door to treat her hypokalemia.  Old records are reviewed, and she does have a prior ED visit for chest pain        Final Clinical Impression(s) / ED Diagnoses Final diagnoses:  Near syncope  Nonspecific chest pain  Hypokalemia    Rx / DC Orders ED Discharge Orders          Ordered    potassium chloride SA (KLOR-CON M) 20 MEQ tablet  2 times daily        09/02/21 0107    Ambulatory referral to Cardiology        09/02/21 0107              Dione Booze, MD 09/02/21 [phone]

## 2021-09-02 NOTE — Discharge Instructions (Signed)
If you get another dizzy spell, lay down until it passes.  If at any point symptoms are concerning, return to the emergency department.

## 2021-09-04 ENCOUNTER — Ambulatory Visit (INDEPENDENT_AMBULATORY_CARE_PROVIDER_SITE_OTHER): Payer: Self-pay

## 2021-09-04 ENCOUNTER — Ambulatory Visit (INDEPENDENT_AMBULATORY_CARE_PROVIDER_SITE_OTHER): Payer: Self-pay | Admitting: Internal Medicine

## 2021-09-04 ENCOUNTER — Other Ambulatory Visit: Payer: Self-pay

## 2021-09-04 ENCOUNTER — Encounter: Payer: Self-pay | Admitting: Internal Medicine

## 2021-09-04 VITALS — BP 127/83 | HR 90 | Ht 64.0 in | Wt 254.0 lb

## 2021-09-04 DIAGNOSIS — R55 Syncope and collapse: Secondary | ICD-10-CM | POA: Insufficient documentation

## 2021-09-04 DIAGNOSIS — R079 Chest pain, unspecified: Secondary | ICD-10-CM

## 2021-09-04 DIAGNOSIS — R002 Palpitations: Secondary | ICD-10-CM

## 2021-09-04 NOTE — Progress Notes (Unsigned)
Enrolled pt for 14 day Zio XT to be mailed to home address. °

## 2021-09-04 NOTE — Patient Instructions (Addendum)
Medication Instructions:  Your physician recommends that you continue on your current medications as directed. Please refer to the Current Medication list given to you today.  *If you need a refill on your cardiac medications before your next appointment, please call your pharmacy*   Lab Work: NONE If you have labs (blood work) drawn today and your tests are completely normal, you will receive your results only by: MyChart Message (if you have MyChart) OR A paper copy in the mail If you have any lab test that is abnormal or we need to change your treatment, we will call you to review the results.   Testing/Procedures: Your physician has requested that you wear a 14 day heart monitor.    Follow-Up: At [facility], you and your health needs are our priority.  As part of our continuing mission to provide you with exceptional heart care, we have created designated Provider Care Teams.  These Care Teams include your primary Cardiologist (physician) and Advanced Practice Providers (APPs -  Physician Assistants and Nurse Practitioners) who all work together to provide you with the care you need, when you need it.    Your next appointment:   5 - 6 month(s)  The format for your next appointment:   In Person  Provider:       Other Instructions Please let our office know when you have insurance   ZIO XT- Long Term Monitor Instructions  Your physician has requested you wear a ZIO patch monitor for 14 days.  This is a single patch monitor. [employer] supplies one patch monitor per enrollment. Additional stickers are not available. Please do not apply patch if you will be having a Nuclear Stress Test,  Echocardiogram, Cardiac CT, MRI, or Chest Xray during the period you would be wearing the  monitor. The patch cannot be worn during these tests. You cannot remove and re-apply the  ZIO XT patch monitor.  Your ZIO patch monitor will be mailed 3 day USPS to your address on file. It may take  3-5 days  to receive your monitor after you have been enrolled.  Once you have received your monitor, please review the enclosed instructions. Your monitor  has already been registered assigning a specific monitor serial # to you.  Billing and Patient Assistance Program Information  We have supplied [employer] with any of your insurance information on file for billing purposes. [employer] offers a sliding scale Patient Assistance Program for patients that do not have  insurance, or whose insurance does not completely cover the cost of the ZIO monitor.  You must apply for the Patient Assistance Program to qualify for this discounted rate.  To apply, please call [employer] at [phone], select option 4, select option 2, ask to apply for  Patient Assistance Program. Meredeth Ide will ask your household income, and how many people  are in your household. They will quote your out-of-pocket cost based on that information.  [employer] will also be able to set up a 103-month, interest-free payment plan if needed.  Applying the monitor   Shave hair from upper left chest.  Hold abrader disc by orange tab. Rub abrader in 40 strokes over the upper left chest as  indicated in your monitor instructions.  Clean area with 4 enclosed alcohol pads. Let dry.  Apply patch as indicated in monitor instructions. Patch will be placed under collarbone on left  side of chest with arrow pointing upward.  Rub patch adhesive wings for 2 minutes. Remove white label marked "  1". Remove the white  label marked "2". Rub patch adhesive wings for 2 additional minutes.  While looking in a mirror, press and release button in center of patch. A small green light will  flash 3-4 times. This will be your only indicator that the monitor has been turned on.  Do not shower for the first 24 hours. You may shower after the first 24 hours.  Press the button if you feel a symptom. You will hear a small click. Record Date, Time and  Symptom in the  Patient Logbook.  When you are ready to remove the patch, follow instructions on the last 2 pages of Patient  Logbook. Stick patch monitor onto the last page of Patient Logbook.  Place Patient Logbook in the blue and white box. Use locking tab on box and tape box closed  securely. The blue and white box has prepaid postage on it. Please place it in the mailbox as  soon as possible. Your physician should have your test results approximately 7 days after the  monitor has been mailed back to [facility].  Call [facility] Customer Care at [phone] if you have questions regarding  your ZIO XT patch monitor. Call them immediately if you see an orange light blinking on your  monitor.  If your monitor falls off in less than 4 days, contact our Monitor department at [phone].  If your monitor becomes loose or falls off after 4 days call [employer] at [phone] for  suggestions on securing your monitor

## 2021-09-14 DIAGNOSIS — R55 Syncope and collapse: Secondary | ICD-10-CM

## 2021-12-10 ENCOUNTER — Emergency Department (HOSPITAL_COMMUNITY): Payer: No Typology Code available for payment source

## 2021-12-10 ENCOUNTER — Ambulatory Visit (HOSPITAL_COMMUNITY)
Admission: EM | Admit: 2021-12-10 | Discharge: 2021-12-10 | Disposition: A | Discharge status: Home / Self Care | Payer: No Typology Code available for payment source

## 2021-12-10 ENCOUNTER — Other Ambulatory Visit (HOSPITAL_COMMUNITY): Payer: No Typology Code available for payment source

## 2021-12-10 ENCOUNTER — Encounter (HOSPITAL_COMMUNITY): Payer: Self-pay | Admitting: Emergency Medicine

## 2021-12-10 ENCOUNTER — Emergency Department (HOSPITAL_COMMUNITY)
Admission: EM | Admit: 2021-12-10 | Discharge: 2021-12-11 | Disposition: A | Discharge status: Home / Self Care | Payer: No Typology Code available for payment source | Attending: Emergency Medicine | Admitting: Emergency Medicine

## 2021-12-10 DIAGNOSIS — K8020 Calculus of gallbladder without cholecystitis without obstruction: Secondary | ICD-10-CM

## 2021-12-10 DIAGNOSIS — R0789 Other chest pain: Secondary | ICD-10-CM

## 2021-12-10 LAB — COMPREHENSIVE METABOLIC PANEL
ALT: 16 U/L (ref 0–44)
AST: 17 U/L (ref 15–41)
Albumin: 4.4 g/dL (ref 3.5–5.0)
Alkaline Phosphatase: 70 U/L (ref 38–126)
Anion gap: 7 (ref 5–15)
BUN: 13 mg/dL (ref 6–20)
CO2: 26 mmol/L (ref 22–32)
Calcium: 9.6 mg/dL (ref 8.9–10.3)
Chloride: 108 mmol/L (ref 98–111)
Creatinine, Ser: 0.73 mg/dL (ref 0.44–1.00)
GFR, Estimated: >60 mL/min (ref >60)
Glucose, Bld: 92 mg/dL (ref 70–99)
Potassium: 3.4 mmol/L — ABNORMAL LOW (ref 3.5–5.1)
Sodium: 141 mmol/L (ref 135–145)
Total Bilirubin: 0.9 mg/dL (ref 0.3–1.2)
Total Protein: 8.3 g/dL — ABNORMAL HIGH (ref 6.5–8.1)

## 2021-12-10 LAB — CBC WITH DIFFERENTIAL/PLATELET
Abs Immature Granulocytes: 0.03 10*3/uL (ref 0.00–0.07)
Basophils Absolute: 0 10*3/uL (ref 0.0–0.1)
Basophils Relative: 1 %
Eosinophils Absolute: 0.1 10*3/uL (ref 0.0–0.5)
Eosinophils Relative: 2 %
HCT: 46.9 % — ABNORMAL HIGH (ref 36.0–46.0)
Hemoglobin: 14.2 g/dL (ref 12.0–15.0)
Immature Granulocytes: 0 %
Lymphocytes Relative: 34 %
Lymphs Abs: 2.6 10*3/uL (ref 0.7–4.0)
MCH: 27.8 pg (ref 26.0–34.0)
MCHC: 30.3 g/dL (ref 30.0–36.0)
MCV: 91.8 fL (ref 80.0–100.0)
Monocytes Absolute: 0.4 10*3/uL (ref 0.1–1.0)
Monocytes Relative: 6 %
Neutro Abs: 4.5 10*3/uL (ref 1.7–7.7)
Neutrophils Relative %: 57 %
Platelets: 308 10*3/uL (ref 150–400)
RBC: 5.11 MIL/uL (ref 3.87–5.11)
RDW: 13.1 % (ref 11.5–15.5)
WBC: 7.8 10*3/uL (ref 4.0–10.5)
nRBC: 0 % (ref 0.0–0.2)

## 2021-12-10 LAB — TROPONIN I (HIGH SENSITIVITY)
Troponin I (High Sensitivity): <2 ng/L (ref <18)
Troponin I (High Sensitivity): 2 ng/L (ref <18)

## 2021-12-10 LAB — LIPASE, BLOOD: Lipase: 40 U/L (ref 11–51)

## 2021-12-10 LAB — D-DIMER, QUANTITATIVE: D-Dimer, Quant: 0.31 ug/mL-FEU (ref 0.00–0.50)

## 2021-12-10 MED ORDER — OMEPRAZOLE 20 MG PO CPDR: 20.0000 mg | DELAYED_RELEASE_CAPSULE | Freq: Every day | ORAL | 0 refills | Status: DC

## 2021-12-10 MED ORDER — ALUM & MAG HYDROXIDE-SIMETH 200-200-20 MG/5ML PO SUSP
30.0000 mL | Freq: Once | ORAL | Status: AC
  Administered 2021-12-10: 30 mL via ORAL
  Filled 2021-12-10: qty 30

## 2021-12-10 NOTE — ED Provider Notes (Signed)
[facility]-EMERGENCY DEPT Provider Note   CSN: [id number] Arrival date & time: 12/10/21  2024     History  Chief Complaint  Patient presents with   Chest Pain    Kelly Haley is a 41 y.o. female.  Pt is a 41 yo female with a pmhx significant for cp thought to be noncardiac.  She started having intermittent cp in Feb.  She was seen in the ED then and was referred to cards.  Pt did f/u with cards and had a holter monitor for 2 weeks.  This was nl.  Pt said she's had intermittent cp.  Pt said she has no sob.  She has not tried any meds for GERD.       Home Medications Prior to Admission medications   Medication Sig Start Date End Date Taking? Authorizing Provider  naproxen sodium (ALEVE) 220 MG tablet Take 220 mg by mouth daily as needed (pain).   Yes [provider]  omeprazole (PRILOSEC) 20 MG capsule Take 1 capsule (20 mg total) by mouth daily. 12/10/21  Yes Jacalyn Lefevre, MD  potassium chloride SA (KLOR-CON M) 20 MEQ tablet Take 1 tablet (20 mEq total) by mouth 2 (two) times daily. Patient not taking: Reported on 12/10/2021 09/02/21   Dione Booze, MD      Allergies    Penicillins    Review of Systems   Review of Systems  Cardiovascular:  Positive for chest pain.  All other systems reviewed and are negative.  Physical Exam Updated Vital Signs BP 134/79   Pulse 81   Temp 98.3 F (36.8 C) (Oral)   Resp 12   SpO2 99%  Physical Exam Vitals and nursing note reviewed.  Constitutional:      Appearance: She is well-developed.  HENT:     Head: Normocephalic and atraumatic.  Eyes:     Extraocular Movements: Extraocular movements intact.     Pupils: Pupils are equal, round, and reactive to light.  Cardiovascular:     Rate and Rhythm: Normal rate and regular rhythm.     Heart sounds: Normal heart sounds.  Pulmonary:     Effort: Pulmonary effort is normal.     Breath sounds: Normal breath sounds.  Abdominal:     General: Bowel  sounds are normal.     Palpations: Abdomen is soft.  Musculoskeletal:        General: Normal range of motion.     Cervical back: Normal range of motion and neck supple.  Skin:    General: Skin is warm.     Capillary Refill: Capillary refill takes less than 2 seconds.  Neurological:     General: No focal deficit present.     Mental Status: She is alert and oriented to person, place, and time.  Psychiatric:        Mood and Affect: Mood normal.        Behavior: Behavior normal.    ED Results / Procedures / Treatments   Labs (all labs ordered are listed, but only abnormal results are displayed) Labs Reviewed  CBC WITH DIFFERENTIAL/PLATELET - Abnormal; Notable for the following components:      Result Value   HCT 46.9 (*)    All other components within normal limits  COMPREHENSIVE METABOLIC PANEL - Abnormal; Notable for the following components:   Potassium 3.4 (*)    Total Protein 8.3 (*)    All other components within normal limits  LIPASE, BLOOD  D-DIMER, QUANTITATIVE  TROPONIN  I (HIGH SENSITIVITY)  TROPONIN I (HIGH SENSITIVITY)    EKG EKG Interpretation  Date/Time:  Thursday Dec 10 2021 20:41:29 EDT Ventricular Rate:  82 PR Interval:  135 QRS Duration: 95 QT Interval:  346 QTC Calculation: 404 R Axis:   46 Text Interpretation: Sinus rhythm Borderline repolarization abnormality No significant change since last tracing Confirmed by Jacalyn LefevreHaviland, Yarnell Kozloski [phone]([id number]) on 12/10/2021 9:13:56 PM  Radiology DG Chest Portable 1 View  Result Date: 12/10/2021 CLINICAL DATA:  Chest and epigastric pain. EXAM: PORTABLE CHEST 1 VIEW COMPARISON:  Chest x-ray 03/22/2011. FINDINGS: Lung volumes are low. There is central pulmonary vascular congestion. Cardiomediastinal silhouette is within normal limits. Costophrenic angles are clear. No pneumothorax or acute fracture. IMPRESSION: 1. Central pulmonary vascular congestion. Electronically Signed   By: Darliss CheneyAmy  Guttmann M.D.   On: 12/10/2021 21:43   US  Abdomen Limited RUQ (LIVER/GB)  Result Date: 12/10/2021 CLINICAL DATA:  Abdominal pain. EXAM: ULTRASOUND ABDOMEN LIMITED RIGHT UPPER QUADRANT COMPARISON:  None Available. FINDINGS: Gallbladder: Gallstones are present measuring up to 2.3 cm. No gallbladder wall thickening. No sonographic Murphy sign noted by sonographer. Common bile duct: Diameter: 3 mm Liver: No focal lesion identified. Increase in parenchymal echogenicity. Portal vein is patent on color Doppler imaging with normal direction of blood flow towards the liver. Other: None. IMPRESSION: 1. Cholelithiasis. No additional sonographic evidence for acute cholecystitis. Electronically Signed   By: Darliss CheneyAmy  Guttmann M.D.   On: 12/10/2021 21:58    Procedures Procedures    Medications Ordered in ED Medications  alum & mag hydroxide-simeth (MAALOX/MYLANTA) 200-200-20 MG/5ML suspension 30 mL (30 mLs Oral Given 12/10/21 2158)    ED Course/ Medical Decision Making/ A&P                           Medical Decision Making Amount and/or Complexity of Data Reviewed Labs: ordered. Radiology: ordered.  Risk OTC drugs. Prescription drug management.   This patient presents to the ED for concern of cp, this involves an extensive number of treatment options, and is a complaint that carries with it a high risk of complications and morbidity.  The differential diagnosis includes cardiac, pulmonary, GI   Co morbidities that complicate the patient evaluation  none   Additional history obtained:  Additional history obtained from epic chart review   Lab Tests:  I Ordered, and personally interpreted labs.  The pertinent results include:  cbc nl; cmp nl; trop nl; ddimer neg   Imaging Studies ordered:  I ordered imaging studies including CXR and GB US  I independently visualized and interpreted imaging which showed  CXR:   IMPRESSION:  1. Central pulmonary vascular congestion.  GB: IMPRESSION:  1. Cholelithiasis. No additional sonographic  evidence for acute  cholecystitis.   I agree with the radiologist interpretation   Cardiac Monitoring:  The patient was maintained on a cardiac monitor.  I personally viewed and interpreted the cardiac monitored which showed an underlying rhythm of: nsr   Medicines ordered and prescription drug management:  I ordered medication including maalox  for pain  Reevaluation of the patient after these medicines showed that the patient improved I have reviewed the patients home medicines and have made adjustments as needed   Test Considered:  Ct chest, but ddimer neg   Critical Interventions:  Pain control    Problem List / ED Course:  Cholelithiasis:  pt to f/u with surgery as needed. Pain is well controlled after gi cocktail. CP:  atypical.  Heart score of 2.  2 negative troponins.  CXR neg.  EKG without any changes.  Pain likely GI or related to her GB.  I will start her on prilosec.   Reevaluation:  After the interventions noted above, I reevaluated the patient and found that they have :improved   Social Determinants of Health:  Lives at home   Dispostion:  After consideration of the diagnostic results and the patients response to treatment, I feel that the patent would benefit from discharge with outpatient f/u.          Final Clinical Impression(s) / ED Diagnoses Final diagnoses:  Calculus of gallbladder without cholecystitis without obstruction  Atypical chest pain    Rx / DC Orders ED Discharge Orders          Ordered    omeprazole (PRILOSEC) 20 MG capsule  Daily        12/10/21 2324              Jacalyn Lefevre, MD 12/10/21 2339

## 2021-12-10 NOTE — ED Triage Notes (Signed)
Pt stated during triage process she was going to the hospital. States she was told by a nurse at [facility] she should either go to Urgent Care or Emergency Room. During triage process pt stated She was told UC will do labs to determine if anything was actively wrong. Pt advised we would do an EKG and if it showed anything she would most likely be referred to the emergency room. Pt then got upset and stated "well you can take this off, Ill just go there". Pt was then advised we could still do the EKG. Pt continued to walk out of UC.

## 2021-12-10 NOTE — ED Triage Notes (Addendum)
Pt has centralized chest pain/epigastric pain. Has seen cardiologist and worn halter monitor and no findings. It is intermittent and comes and goes. Pt has not been on GERD/reflux meds ever. She describes pain as "sore" and touching and pushing into her chest makes it worse. Reports sometimes she feels like she cant get good deep breathe. Denies use of BCP and nonsmoker.

## 2022-02-15 ENCOUNTER — Ambulatory Visit (INDEPENDENT_AMBULATORY_CARE_PROVIDER_SITE_OTHER): Payer: Self-pay | Admitting: Surgery

## 2022-02-15 ENCOUNTER — Encounter: Payer: Self-pay | Admitting: Surgery

## 2022-02-15 VITALS — BP 113/77 | HR 80 | Temp 98.1°F | Ht 64.0 in | Wt 241.0 lb

## 2022-02-15 DIAGNOSIS — K8020 Calculus of gallbladder without cholecystitis without obstruction: Secondary | ICD-10-CM

## 2022-02-15 NOTE — Patient Instructions (Addendum)
You have requested to have your gallbladder removed. This will be done at [facility] with Dr. Everlene Farrier.  You will most likely be out of work 1 week for this surgery.  If you have FMLA or disability paperwork that needs filled out you may drop this off at our office or this can be faxed to [phone].  You will return after your post-op appointment with a lifting restriction for approximately 4 more weeks.  You will be able to eat anything you would like to following surgery. But, start by eating a bland diet and advance this as tolerated. The Gallbladder diet is below, please go as closely by this diet as possible prior to surgery to avoid any further attacks.  Please see the (blue)pre-care form that you have been given today. Our surgery scheduler will call you to verify surgery date and to go over information.   If you have any questions, please call our office.   Laparoscopic Cholecystectomy Laparoscopic cholecystectomy is surgery to remove the gallbladder. The gallbladder is located in the upper right part of the abdomen, behind the liver. It is a storage sac for bile, which is produced in the liver. Bile aids in the digestion and absorption of fats. Cholecystectomy is often done for inflammation of the gallbladder (cholecystitis). This condition is usually caused by a buildup of gallstones (cholelithiasis) in the gallbladder. Gallstones can block the flow of bile, and that can result in inflammation and pain. In severe cases, emergency surgery may be required. If emergency surgery is not required, you will have time to prepare for the procedure. Laparoscopic surgery is an alternative to open surgery. Laparoscopic surgery has a shorter recovery time. Your common bile duct may also need to be examined during the procedure. If stones are found in the common bile duct, they may be removed. LET [facility] CARE PROVIDER KNOW ABOUT: Any allergies you have. All medicines you are taking,  including vitamins, herbs, eye drops, creams, and over-the-counter medicines. Previous problems you or members of your family have had with the use of anesthetics. Any blood disorders you have. Previous surgeries you have had.  Any medical conditions you have. RISKS AND COMPLICATIONS Generally, this is a safe procedure. However, problems may occur, including: Infection. Bleeding. Allergic reactions to medicines. Damage to other structures or organs. A stone remaining in the common bile duct. A bile leak from the cyst duct that is clipped when your gallbladder is removed. The need to convert to open surgery, which requires a larger incision in the abdomen. This may be necessary if your surgeon thinks that it is not safe to continue with a laparoscopic procedure. BEFORE THE PROCEDURE Ask your health care provider about: Changing or stopping your regular medicines. This is especially important if you are taking diabetes medicines or blood thinners. Taking medicines such as aspirin and ibuprofen. These medicines can thin your blood. Do not take these medicines before your procedure if your health care provider instructs you not to. Follow instructions from your health care provider about eating or drinking restrictions. Let your health care provider know if you develop a cold or an infection before surgery. Plan to have someone take you home after the procedure. Ask your health care provider how your surgical site will be marked or identified. You may be given antibiotic medicine to help prevent infection. PROCEDURE To reduce your risk of infection: Your health care team will wash or sanitize their hands. Your skin will be washed with soap. An  IV tube may be inserted into one of your veins. You will be given a medicine to make you fall asleep (general anesthetic). A breathing tube will be placed in your mouth. The surgeon will make several small cuts (incisions) in your abdomen. A thin,  lighted tube (laparoscope) that has a tiny camera on the end will be inserted through one of the small incisions. The camera on the laparoscope will send a picture to a TV screen (monitor) in the operating room. This will give the surgeon a good view inside your abdomen. A gas will be pumped into your abdomen. This will expand your abdomen to give the surgeon more room to perform the surgery. Other tools that are needed for the procedure will be inserted through the other incisions. The gallbladder will be removed through one of the incisions. After your gallbladder has been removed, the incisions will be closed with stitches (sutures), staples, or skin glue. Your incisions may be covered with a bandage (dressing). The procedure may vary among health care providers and hospitals. AFTER THE PROCEDURE Your blood pressure, heart rate, breathing rate, and blood oxygen level will be monitored often until the medicines you were given have worn off. You will be given medicines as needed to control your pain.   This information is not intended to replace advice given to you by your health care provider. Make sure you discuss any questions you have with your health care provider.   Document Released: 07/05/2005 Document Revised: 03/26/2015 Document Reviewed: 02/14/2013 Elsevier Interactive Patient Education 2016 [employer] Inc[employer]   Low-Fat Diet for Gallbladder Conditions A low-fat diet can be helpful if you have pancreatitis or a gallbladder condition. With these conditions, your pancreas and gallbladder have trouble digesting fats. A healthy eating plan with less fat will help rest your pancreas and gallbladder and reduce your symptoms. WHAT DO I NEED TO KNOW ABOUT THIS DIET? Eat a low-fat diet. Reduce your fat intake to less than 20-30% of your total daily calories. This is less than 50-60 g of fat per day. Remember that you need some fat in your diet. Ask your dietician what your daily goal should  be. Choose nonfat and low-fat healthy foods. Look for the words "nonfat," "low fat," or "fat free." As a guide, look on the label and choose foods with less than 3 g of fat per serving. Eat only one serving. Avoid alcohol. Do not smoke. If you need help quitting, talk with your health care provider. Eat small frequent meals instead of three large heavy meals. WHAT FOODS CAN I EAT? Grains Include healthy grains and starches such as potatoes, wheat bread, fiber-rich cereal, and brown rice. Choose whole grain options whenever possible. In adults, whole grains should account for 45-65% of your daily calories.  Fruits and Vegetables Eat plenty of fruits and vegetables. Fresh fruits and vegetables add fiber to your diet. Meats and Other Protein Sources Eat lean meat such as chicken and pork. Trim any fat off of meat before cooking it. Eggs, fish, and beans are other sources of protein. In adults, these foods should account for 10-35% of your daily calories. Dairy Choose low-fat milk and dairy options. Dairy includes fat and protein, as well as calcium.  Fats and Oils Limit high-fat foods such as fried foods, sweets, baked goods, sugary drinks.  Other Creamy sauces and condiments, such as mayonnaise, can add extra fat. Think about whether or not you need to use them, or use smaller amounts or low fat  options. WHAT FOODS ARE NOT RECOMMENDED? High fat foods, such as: [employer]. Ice cream. [place] toast. Sweet rolls. Pizza. Cheese bread. Foods covered with batter, butter, creamy sauces, or cheese. Fried foods. Sugary drinks and desserts. Foods that cause gas or bloating   This information is not intended to replace advice given to you by your health care provider. Make sure you discuss any questions you have with your health care provider.   Document Released: 07/10/2013 Document Reviewed: 07/10/2013 Elsevier Interactive Patient Education [employer]

## 2022-02-16 ENCOUNTER — Telehealth: Payer: Self-pay | Admitting: Surgery

## 2022-02-16 ENCOUNTER — Encounter: Payer: Self-pay | Admitting: Surgery

## 2022-02-16 NOTE — Telephone Encounter (Signed)
Patient has been advised of Pre-Admission date/time, and Surgery date.  Surgery Date: 03/02/22 Preadmission Testing Date: 02/23/22 (phone 1p-5p)  Patient has been made aware to call [phone], between 1-3:00pm the day before surgery, to find out what time to arrive for surgery.

## 2022-02-16 NOTE — Progress Notes (Signed)
Patient ID: Kelly Haley, female   DOB: 06/03/1981, 41 y.o.   MRN: [id number]  HPI Kelly Haley is a 41 y.o. female seen in consultation at the request of Dr. Haviland for symptomatic cholelithiasis.  She endorses epigastric pain for the last couple months or so.  The pain is intermittent.  Initially was brought by meals but currently seems to be present quite frequently.  It is intermittent, sharp and moderate in intensity.  Currently there is no specific alleviating or aggravating factors.  She specifically denies any vomiting but does report decreased appetite and some nausea.  No diarrhea.  She denies any jaundice or fevers. She is a vet tech.  She is able to perform more than 4 METS of activity without any shortness of breath or chest pain. Recent visit to the ER included right upper quadrant ultrasound that I personally reviewed showing evidence of cholelithiasis without evidence of cholecystitis.  BMP was normal CBC was normal. She was also work-up for atypical chest pain and cardiac work-up was negative. She does have a history of laparoscopic appendectomy a few years ago without any complications Her father had gallbladder issues and responded well to cholecystectomy  HPI  Past Medical History:  Diagnosis Date   Appendicitis    Chest pain    Dental infection    Flank pain    Headache    Hearing loss    Hypokalemia    Lumbar pain    Mass of right breast    Medical history non-contributory    Muscle strain    Near syncope    Pharyngitis    Pulsatile tinnitus of right ear    Sensorineural hearing loss    Urinary frequency    UTI (urinary tract infection)    Weakness     Past Surgical History:  Procedure Laterality Date   APPENDECTOMY  09/06/2018   CARPAL TUNNEL RELEASE     CARPAL TUNNEL RELEASE     LAPAROSCOPIC APPENDECTOMY N/A 09/06/2018   Procedure: APPENDECTOMY LAPAROSCOPIC;  Surgeon: Ramirez, Armando, MD;  Location: [facility] OR;  Service: General;  Laterality:  N/A;   TUBAL LIGATION      Family History  Problem Relation Age of Onset   Colon cancer Father     Social History Social History   Tobacco Use   Smoking status: Never    Passive exposure: Past   Smokeless tobacco: Never  Vaping Use   Vaping Use: Never used  Substance Use Topics   Alcohol use: Yes    Comment: occ.   Drug use: No   Allerg: PCN rash   Current Outpatient Medications  Medication Sig Dispense Refill   Cholecalciferol (VITAMIN D3) 1.25 MG (50000 UT) CAPS Take 1 capsule by mouth once a week.     escitalopram (LEXAPRO) 10 MG tablet Take 10 mg by mouth daily.     omeprazole (PRILOSEC) 20 MG capsule Take 1 capsule (20 mg total) by mouth daily. 30 capsule 0   No current facility-administered medications for this visit.     Review of Systems Full ROS  was asked and was negative except for the information on the HPI  Physical Exam Blood pressure 113/77, pulse 80, temperature 98.1 F (36.7 C), height 5' 4" (1.626 m), weight 241 lb (109.3 kg), last menstrual period 12/21/2021, SpO2 98 %. CONSTITUTIONAL: NAD. EYES: Pupils are equal, round,Sclera are non-icteric. EARS, NOSE, MOUTH AND THROAT:  The oral mucosa is pink and moist. Hearing is intact to voice. LYMPH NODES:    Lymph nodes in the neck are normal. RESPIRATORY:  Lungs are clear. There is normal respiratory effort, with equal breath sounds bilaterally, and without pathologic use of accessory muscles. CARDIOVASCULAR: Heart is regular without murmurs, gallops, or rubs. GI: The abdomen is  soft, nontender, and nondistended. There are no palpable masses. There is no hepatosplenomegaly. There are normal bowel sounds in all quadrants. GU: Rectal deferred.   MUSCULOSKELETAL: Normal muscle strength and tone. No cyanosis or edema.   SKIN: Turgor is good and there are no pathologic skin lesions or ulcers. NEUROLOGIC: Motor and sensation is grossly normal. Cranial nerves are grossly intact. PSYCH:  Oriented to person,  place and time. Affect is normal.  Data Reviewed  I have personally reviewed the patient's imaging, laboratory findings and medical records.    Assessment /Plan 41 year old female with epigastric pain and gallstones.  Discussed with the patient in detail that her pain certainly can be explained by symptomatic cholelithiasis.  The pain is not 100% classic and there is always a chance that might be explained but other etiologies including but not limited to irritable bowel syndrome, peptic ulcer disease.  Discussed with patient detail about the role of cholecystectomy.  Alternatives also include consultation with GI for further diagnostic work-up versus proceeding with cholecystectomy. Patient wishes to proceed with cholecystectomy and I do think that she will be a good candidate for robotic approach. I discussed the procedure in detail.  The patient was given Agricultural engineer.  We discussed the risks and benefits of a laparoscopic cholecystectomy and possible cholangiogram including, but not limited to bleeding, infection, injury to surrounding structures such as the intestine or liver, bile leak, retained gallstones, need to convert to an open procedure, prolonged diarrhea, blood clots such as  DVT, common bile duct injury, anesthesia risks, and possible need for additional procedures.  The likelihood of improvement in symptoms and return to the patient's normal status is good. We discussed the typical post-operative recovery course.  A copy of this report was sent to the referring provider I spent greater than 55 minutes in this encounter including coordination of her care, personally reviewing imaging studies, operative records, placing orders and performing appropriate documentation  Sterling Big, MD FACS General Surgeon 02/16/2022, 7:28 AM

## 2022-02-16 NOTE — H&P (View-Only) (Signed)
Patient ID: Kelly Haley, female   DOB: 02-Feb-1981, 41 y.o.   MRN: [id number]  HPI Kelly Haley is a 41 y.o. female seen in consultation at the request of Dr. Particia Nearing for symptomatic cholelithiasis.  She endorses epigastric pain for the last couple months or so.  The pain is intermittent.  Initially was brought by meals but currently seems to be present quite frequently.  It is intermittent, sharp and moderate in intensity.  Currently there is no specific alleviating or aggravating factors.  She specifically denies any vomiting but does report decreased appetite and some nausea.  No diarrhea.  She denies any jaundice or fevers. She is a Museum/gallery conservator.  She is able to perform more than 4 METS of activity without any shortness of breath or chest pain. Recent visit to the ER included right upper quadrant ultrasound that I personally reviewed showing evidence of cholelithiasis without evidence of cholecystitis.  BMP was normal CBC was normal. She was also work-up for atypical chest pain and cardiac work-up was negative. She does have a history of laparoscopic appendectomy a few years ago without any complications Her father had gallbladder issues and responded well to cholecystectomy  HPI  Past Medical History:  Diagnosis Date   Appendicitis    Chest pain    Dental infection    Flank pain    Headache    Hearing loss    Hypokalemia    Lumbar pain    Mass of right breast    Medical history non-contributory    Muscle strain    Near syncope    Pharyngitis    Pulsatile tinnitus of right ear    Sensorineural hearing loss    Urinary frequency    UTI (urinary tract infection)    Weakness     Past Surgical History:  Procedure Laterality Date   APPENDECTOMY  09/06/2018   CARPAL TUNNEL RELEASE     CARPAL TUNNEL RELEASE     LAPAROSCOPIC APPENDECTOMY N/A 09/06/2018   Procedure: APPENDECTOMY LAPAROSCOPIC;  Surgeon: Axel Filler, MD;  Location: [facility] OR;  Service: General;  Laterality:  N/A;   TUBAL LIGATION      Family History  Problem Relation Age of Onset   Colon cancer Father     Social History Social History   Tobacco Use   Smoking status: Never    Passive exposure: Past   Smokeless tobacco: Never  Vaping Use   Vaping Use: Never used  Substance Use Topics   Alcohol use: Yes    Comment: occ.   Drug use: No   Allerg: PCN rash   Current Outpatient Medications  Medication Sig Dispense Refill   Cholecalciferol (VITAMIN D3) 1.25 MG (50000 UT) CAPS Take 1 capsule by mouth once a week.     escitalopram (LEXAPRO) 10 MG tablet Take 10 mg by mouth daily.     omeprazole (PRILOSEC) 20 MG capsule Take 1 capsule (20 mg total) by mouth daily. 30 capsule 0   No current facility-administered medications for this visit.     Review of Systems Full ROS  was asked and was negative except for the information on the HPI  Physical Exam Blood pressure 113/77, pulse 80, temperature 98.1 F (36.7 C), height 5\' 4"  (1.626 m), weight 241 lb (109.3 kg), last menstrual period 12/21/2021, SpO2 98 %. CONSTITUTIONAL: NAD. EYES: Pupils are equal, round,Sclera are non-icteric. EARS, NOSE, MOUTH AND THROAT:  The oral mucosa is pink and moist. Hearing is intact to voice. LYMPH NODES:  Lymph nodes in the neck are normal. RESPIRATORY:  Lungs are clear. There is normal respiratory effort, with equal breath sounds bilaterally, and without pathologic use of accessory muscles. CARDIOVASCULAR: Heart is regular without murmurs, gallops, or rubs. GI: The abdomen is  soft, nontender, and nondistended. There are no palpable masses. There is no hepatosplenomegaly. There are normal bowel sounds in all quadrants. GU: Rectal deferred.   MUSCULOSKELETAL: Normal muscle strength and tone. No cyanosis or edema.   SKIN: Turgor is good and there are no pathologic skin lesions or ulcers. NEUROLOGIC: Motor and sensation is grossly normal. Cranial nerves are grossly intact. PSYCH:  Oriented to person,  place and time. Affect is normal.  Data Reviewed  I have personally reviewed the patient's imaging, laboratory findings and medical records.    Assessment /Plan 41 year old female with epigastric pain and gallstones.  Discussed with the patient in detail that her pain certainly can be explained by symptomatic cholelithiasis.  The pain is not 100% classic and there is always a chance that might be explained but other etiologies including but not limited to irritable bowel syndrome, peptic ulcer disease.  Discussed with patient detail about the role of cholecystectomy.  Alternatives also include consultation with GI for further diagnostic work-up versus proceeding with cholecystectomy. Patient wishes to proceed with cholecystectomy and I do think that she will be a good candidate for robotic approach. I discussed the procedure in detail.  The patient was given Agricultural engineer.  We discussed the risks and benefits of a laparoscopic cholecystectomy and possible cholangiogram including, but not limited to bleeding, infection, injury to surrounding structures such as the intestine or liver, bile leak, retained gallstones, need to convert to an open procedure, prolonged diarrhea, blood clots such as  DVT, common bile duct injury, anesthesia risks, and possible need for additional procedures.  The likelihood of improvement in symptoms and return to the patient's normal status is good. We discussed the typical post-operative recovery course.  A copy of this report was sent to the referring provider I spent greater than 55 minutes in this encounter including coordination of her care, personally reviewing imaging studies, operative records, placing orders and performing appropriate documentation  Sterling Big, MD FACS General Surgeon 02/16/2022, 7:28 AM

## 2022-02-22 NOTE — Progress Notes (Deleted)
Cardiology Office Note:    Date:  02/22/2022   ID:  JEVON LITTLEPAGE, DOB 06-10-1981, MRN [id number]  PCP:  Pcp, No   [facility] Providers Cardiologist:  Christell Constant, MD     Referring MD: No ref. provider found   CC: Chest pain follow up  History of Present Illness:    Kelly Haley is a 41 y.o. female with a hx of morbid obesity and Heart murmur  who presents for evaluation. 2023: established care; had normal heart monitor, atypical sx and deferred CCTA and Echo; she has had interval diagnosis of biliary disease; discussed orange care.  Patient notes that she is doing ***.   Since last visit notes *** . There are no*** interval hospital/ED visit.    No chest pain or pressure ***.  No SOB/DOE*** and no PND/Orthopnea***.  No weight gain or leg swelling***.  No palpitations or syncope ***.  Ambulatory blood pressure ***.    Past Medical History:  Diagnosis Date   Appendicitis    Chest pain    Dental infection    Flank pain    Headache    Hearing loss    Hypokalemia    Lumbar pain    Mass of right breast    Medical history non-contributory    Muscle strain    Near syncope    Pharyngitis    Pulsatile tinnitus of right ear    Sensorineural hearing loss    Urinary frequency    UTI (urinary tract infection)    Weakness     Past Surgical History:  Procedure Laterality Date   APPENDECTOMY  09/06/2018   CARPAL TUNNEL RELEASE     CARPAL TUNNEL RELEASE     LAPAROSCOPIC APPENDECTOMY N/A 09/06/2018   Procedure: APPENDECTOMY LAPAROSCOPIC;  Surgeon: Axel Filler, MD;  Location: [facility] OR;  Service: General;  Laterality: N/A;   TUBAL LIGATION      Current Medications: No outpatient medications have been marked as taking for the 02/23/22 encounter (Appointment) with Christell Constant, MD.     Allergies:   Penicillins   Social History   Socioeconomic History   Marital status: Legally Separated    Spouse name: Not on file   Number of  children: Not on file   Years of education: Not on file   Highest education level: Not on file  Occupational History   Not on file  Tobacco Use   Smoking status: Never    Passive exposure: Past   Smokeless tobacco: Never  Vaping Use   Vaping Use: Never used  Substance and Sexual Activity   Alcohol use: Yes    Comment: occ.   Drug use: No   Sexual activity: Yes    Birth control/protection: Surgical  Other Topics Concern   Not on file  Social History Narrative   Not on file   Social Determinants of Health   Financial Resource Strain: Not on file  Food Insecurity: No Food Insecurity (06/04/2021)   Hunger Vital Sign    Worried About Running Out of Food in the Last Year: Never true    Ran Out of Food in the Last Year: Never true  Transportation Needs: No Transportation Needs (06/04/2021)   PRAPARE - Administrator, Civil Service (Medical): No    Lack of Transportation (Non-Medical): No  Physical Activity: Not on file  Stress: Not on file  Social Connections: Not on file     Family History: The patient's family history includes  Colon cancer in her father.  ROS:   Please see the history of present illness.     All other systems reviewed and are negative.  EKGs/Labs/Other Studies Reviewed:    The following studies were reviewed today:  EKG:   09/02/21: SR with sinus arrhythmia rate 96 non-specific TWI   LONG TERM MONITOR (3-7 DAYS) INTERPRETATION 09/28/2021  Narrative  Patient had a minimum heart rate of 47 bpm, maximum heart rate of 160 bpm, and average heart rate of 80 bpm.  Predominant underlying rhythm was sinus rhythm.  Isolated PACs were rare (<1.0%).  Isolated PVCs were rare (<1.0%).  Triggered and diary events associated with sinus rhythm, sinus tachycardia, rarely PACs and PVCs.  No malignant arrhythmias.   Recent Labs: 12/10/2021: ALT 16; BUN 13; Creatinine, Ser 0.73; Hemoglobin 14.2; Platelets 308; Potassium 3.4; Sodium 141  Recent  Lipid Panel No results found for: "CHOL", "TRIG", "HDL", "CHOLHDL", "VLDL", "LDLCALC", "LDLDIRECT"   Physical Exam:    VS:  LMP 12/21/2021     Wt Readings from Last 3 Encounters:  02/15/22 241 lb (109.3 kg)  09/04/21 254 lb (115.2 kg)  06/11/20 215 lb (97.5 kg)     Gen: No distress, morbid obesity Neck: No JVD,  carotid bruit Cardiac: No Rubs or Gallops, no Murmur, RRR +2radial pulses Respiratory: Clear to auscultation bilaterally, normal effort, normal  respiratory rate GI: Soft, nontender, non-distended  MS: No  edema;  moves all extremities Integument: Skin feels warm Neuro:  At time of evaluation, alert and oriented to person/place/time/situation  Psych: Anxious affect, patient feels anxious   ASSESSMENT:    No diagnosis found.  PLAN:    Near Syncope Biliary Disease PACs Chest Pain SDH- Uninsured Morbid obesity - Overall her sequale does not seem cardiac but she notes a fairly robust testing prior without any cause for her discomfort - Supporting patient in getting the [facility] card, she will let us know where paperwork is through and we will get an echocardiogram - if persistent symptoms with negative testing about will get CCTA  PRN      Medication Adjustments/Labs and Tests Ordered: Current medicines are reviewed at length with the patient today.  Concerns regarding medicines are outlined above.  No orders of the defined types were placed in this encounter.  No orders of the defined types were placed in this encounter.   There are no Patient Instructions on file for this visit.   Signed, Christell Constant, MD  02/22/2022 8:43 AM    [facility]

## 2022-02-23 ENCOUNTER — Encounter
Admission: RE | Admit: 2022-02-23 | Discharge: 2022-02-23 | Disposition: A | Discharge status: Home / Self Care | Payer: Self-pay | Source: Ambulatory Visit | Attending: Surgery | Admitting: Surgery

## 2022-02-23 ENCOUNTER — Ambulatory Visit: Payer: No Typology Code available for payment source | Admitting: Internal Medicine

## 2022-02-23 ENCOUNTER — Other Ambulatory Visit: Payer: Self-pay

## 2022-02-23 VITALS — Ht 64.0 in | Wt 239.0 lb

## 2022-02-23 DIAGNOSIS — Z01818 Encounter for other preprocedural examination: Secondary | ICD-10-CM

## 2022-02-23 HISTORY — DX: Anxiety disorder, unspecified: F41.9

## 2022-02-23 NOTE — Patient Instructions (Signed)
Your procedure is scheduled on: 03/02/22 Report to DAY SURGERY DEPARTMENT LOCATED ON 2ND FLOOR MEDICAL MALL ENTRANCE. To find out your arrival time please call [phone] between 1PM - 3PM on 03/01/22.  Remember: Instructions that are not followed completely may result in serious medical risk, up to and including death, or upon the discretion of your surgeon and anesthesiologist your surgery may need to be rescheduled.     _X__ 1. Do not eat food after midnight the night before your procedure.                 No gum chewing or hard candies. You may drink clear liquids up to 2 hours                 before you are scheduled to arrive for your surgery- DO not drink clear                 liquids within 2 hours of the start of your surgery.                 Clear Liquids include:  water, apple juice without pulp, clear carbohydrate                 drink such as Clearfast or Gatorade, Black Coffee or Tea (Do not add                 anything to coffee or tea). Diabetics water only  __X__2.  On the morning of surgery brush your teeth with toothpaste and water, you                 may rinse your mouth with mouthwash if you wish.  Do not swallow any              toothpaste of mouthwash.     _X__ 3.  No Alcohol for 24 hours before or after surgery.   _X__ 4.  Do Not Smoke or use e-cigarettes For 24 Hours Prior to Your Surgery.                 Do not use any chewable tobacco products for at least 6 hours prior to                 surgery.  ____  5.  Bring all medications with you on the day of surgery if instructed.   __X__  6.  Notify your doctor if there is any change in your medical condition      (cold, fever, infections).     Do not wear jewelry, make-up, hairpins, clips or nail polish. Do not wear lotions, powders, or perfumes. You  may use deodorant. Do not shave body hair 48 hours prior to surgery. Men may shave face and neck. Do not bring valuables to the hospital.    [facility] is not  responsible for any belongings or valuables.  Contacts, dentures/partials or body piercings may not be worn into surgery. Bring a case for your contacts, glasses or hearing aids, a denture cup will be supplied. Leave your suitcase in the car. After surgery it may be brought to your room. For patients admitted to the hospital, discharge time is determined by your treatment team.   Patients discharged the day of surgery will not be allowed to drive home.    __X__ Take these medicines the morning of surgery with A SIP OF WATER:    1. omeprazole (PRILOSEC) 20 MG capsule  2.  3.   4.  5.  6.  ____ Fleet Enema (as directed)   ____ Use CHG Soap/SAGE wipes as directed  ____ Use inhalers on the day of surgery  ____ Stop metformin/Janumet/Farxiga 2 days prior to surgery    ____ Take 1/2 of usual insulin dose the night before surgery. No insulin the morning          of surgery.   ____ Stop Blood Thinners Coumadin/Plavix/Xarelto/Pleta/Pradaxa/Eliquis/Effient/Aspirin  on   Or contact your Surgeon, Cardiologist or Medical Doctor regarding  ability to stop your blood thinners  __X__ Stop Anti-inflammatories 7 days before surgery such as Advil, Ibuprofen, Motrin,  BC or Goodies Powder, Naprosyn, Naproxen, Aleve, Aspirin    __X__ Stop all herbals and supplements, fish oil or vitamins  until after surgery.  You do not have to stop your vitamin D  ____ Bring C-Pap to the hospital.    You may use Tylenol if needed for pain

## 2022-03-01 MED ORDER — ACETAMINOPHEN 500 MG PO TABS: 1000.0000 mg | ORAL_TABLET | ORAL | Status: AC

## 2022-03-01 MED ORDER — CELECOXIB 200 MG PO CAPS: 200.0000 mg | ORAL_CAPSULE | ORAL | Status: AC

## 2022-03-01 MED ORDER — CHLORHEXIDINE GLUCONATE 0.12 % MT SOLN: 15.0000 mL | Freq: Once | OROMUCOSAL | Status: AC

## 2022-03-01 MED ORDER — ORAL CARE MOUTH RINSE: 15.0000 mL | Freq: Once | OROMUCOSAL | Status: AC

## 2022-03-01 MED ORDER — INDOCYANINE GREEN 25 MG IV SOLR
2.5000 mg | Freq: Once | INTRAVENOUS | Status: AC
  Administered 2022-03-02: 2.5 mg via INTRAVENOUS
  Filled 2022-03-01: qty 1

## 2022-03-01 MED ORDER — GABAPENTIN 300 MG PO CAPS: 300.0000 mg | ORAL_CAPSULE | ORAL | Status: AC

## 2022-03-01 MED ORDER — LACTATED RINGERS IV SOLN: INTRAVENOUS | Status: DC

## 2022-03-01 MED ORDER — CEFAZOLIN SODIUM-DEXTROSE 2-4 GM/100ML-% IV SOLN
2.0000 g | INTRAVENOUS | Status: AC
  Administered 2022-03-02: 2 g via INTRAVENOUS

## 2022-03-01 MED ORDER — CHLORHEXIDINE GLUCONATE CLOTH 2 % EX PADS: 6.0000 | MEDICATED_PAD | Freq: Once | CUTANEOUS | Status: DC

## 2022-03-01 MED ORDER — CHLORHEXIDINE GLUCONATE CLOTH 2 % EX PADS
6.0000 | MEDICATED_PAD | Freq: Once | CUTANEOUS | Status: AC
  Administered 2022-03-02: 6 via TOPICAL

## 2022-03-02 ENCOUNTER — Other Ambulatory Visit: Payer: Self-pay

## 2022-03-02 ENCOUNTER — Encounter: Payer: Self-pay | Admitting: Surgery

## 2022-03-02 ENCOUNTER — Ambulatory Visit: Payer: No Typology Code available for payment source | Admitting: Certified Registered"

## 2022-03-02 ENCOUNTER — Encounter
Admission: RE | Disposition: A | Discharge status: Home / Self Care | Payer: Self-pay | Source: Ambulatory Visit | Attending: Surgery

## 2022-03-02 ENCOUNTER — Ambulatory Visit
Admission: RE | Admit: 2022-03-02 | Discharge: 2022-03-02 | Disposition: A | Discharge status: Home / Self Care | Payer: No Typology Code available for payment source | Source: Ambulatory Visit | Attending: Surgery | Admitting: Surgery

## 2022-03-02 DIAGNOSIS — K8020 Calculus of gallbladder without cholecystitis without obstruction: Secondary | ICD-10-CM

## 2022-03-02 DIAGNOSIS — K8060 Calculus of gallbladder and bile duct with cholecystitis, unspecified, without obstruction: Secondary | ICD-10-CM | POA: Insufficient documentation

## 2022-03-02 DIAGNOSIS — Z9049 Acquired absence of other specified parts of digestive tract: Secondary | ICD-10-CM | POA: Insufficient documentation

## 2022-03-02 DIAGNOSIS — Z01818 Encounter for other preprocedural examination: Secondary | ICD-10-CM

## 2022-03-02 DIAGNOSIS — K8010 Calculus of gallbladder with chronic cholecystitis without obstruction: Secondary | ICD-10-CM

## 2022-03-02 LAB — POCT PREGNANCY, URINE: Preg Test, Ur: NEGATIVE

## 2022-03-02 SURGERY — CHOLECYSTECTOMY, ROBOT-ASSISTED, LAPAROSCOPIC
Anesthesia: General

## 2022-03-02 MED ORDER — PROPOFOL 10 MG/ML IV BOLUS
INTRAVENOUS | Status: DC | PRN
  Administered 2022-03-02: 150 mg via INTRAVENOUS
  Administered 2022-03-02: 50 mg via INTRAVENOUS

## 2022-03-02 MED ORDER — ACETAMINOPHEN 500 MG PO TABS
ORAL_TABLET | ORAL | Status: AC
  Administered 2022-03-02: 1000 mg via ORAL
  Filled 2022-03-02: qty 2

## 2022-03-02 MED ORDER — BUPIVACAINE LIPOSOME 1.3 % IJ SUSP
INTRAMUSCULAR | Status: AC
  Filled 2022-03-02: qty 20

## 2022-03-02 MED ORDER — EPHEDRINE SULFATE (PRESSORS) 50 MG/ML IJ SOLN
INTRAMUSCULAR | Status: DC | PRN
  Administered 2022-03-02 (×2): 5 mg via INTRAVENOUS

## 2022-03-02 MED ORDER — FENTANYL CITRATE (PF) 100 MCG/2ML IJ SOLN
INTRAMUSCULAR | Status: AC
  Filled 2022-03-02: qty 2

## 2022-03-02 MED ORDER — CELECOXIB 200 MG PO CAPS
ORAL_CAPSULE | ORAL | Status: AC
  Administered 2022-03-02: 200 mg via ORAL
  Filled 2022-03-02: qty 1

## 2022-03-02 MED ORDER — OXYCODONE HCL 5 MG PO TABS
ORAL_TABLET | ORAL | Status: AC
  Filled 2022-03-02: qty 1

## 2022-03-02 MED ORDER — OXYCODONE HCL 5 MG PO TABS
5.0000 mg | ORAL_TABLET | Freq: Once | ORAL | Status: AC | PRN
  Administered 2022-03-02: 5 mg via ORAL

## 2022-03-02 MED ORDER — DEXMEDETOMIDINE HCL IN NACL 200 MCG/50ML IV SOLN
INTRAVENOUS | Status: DC | PRN
  Administered 2022-03-02: 12 ug via INTRAVENOUS
  Administered 2022-03-02: 4 ug via INTRAVENOUS

## 2022-03-02 MED ORDER — PROPOFOL 1000 MG/100ML IV EMUL
INTRAVENOUS | Status: AC
  Filled 2022-03-02: qty 100

## 2022-03-02 MED ORDER — SUGAMMADEX SODIUM 500 MG/5ML IV SOLN
INTRAVENOUS | Status: AC
  Filled 2022-03-02: qty 5

## 2022-03-02 MED ORDER — BUPIVACAINE-EPINEPHRINE (PF) 0.25% -1:200000 IJ SOLN
INTRAMUSCULAR | Status: DC | PRN
  Administered 2022-03-02: 30 mL

## 2022-03-02 MED ORDER — ONDANSETRON HCL 4 MG/2ML IJ SOLN: 4.0000 mg | Freq: Once | INTRAMUSCULAR | Status: DC | PRN

## 2022-03-02 MED ORDER — MIDAZOLAM HCL 2 MG/2ML IJ SOLN
INTRAMUSCULAR | Status: AC
  Filled 2022-03-02: qty 2

## 2022-03-02 MED ORDER — FENTANYL CITRATE (PF) 100 MCG/2ML IJ SOLN
25.0000 ug | INTRAMUSCULAR | Status: AC | PRN
  Administered 2022-03-02 (×4): 25 ug via INTRAVENOUS

## 2022-03-02 MED ORDER — OXYCODONE HCL 5 MG/5ML PO SOLN: 5.0000 mg | Freq: Once | ORAL | Status: AC | PRN

## 2022-03-02 MED ORDER — SUGAMMADEX SODIUM 500 MG/5ML IV SOLN
INTRAVENOUS | Status: DC | PRN
  Administered 2022-03-02: 500 mg via INTRAVENOUS

## 2022-03-02 MED ORDER — BUPIVACAINE-EPINEPHRINE (PF) 0.25% -1:200000 IJ SOLN
INTRAMUSCULAR | Status: AC
  Filled 2022-03-02: qty 30

## 2022-03-02 MED ORDER — MIDAZOLAM HCL 2 MG/2ML IJ SOLN
INTRAMUSCULAR | Status: DC | PRN
  Administered 2022-03-02: 2 mg via INTRAVENOUS

## 2022-03-02 MED ORDER — ROCURONIUM BROMIDE 100 MG/10ML IV SOLN
INTRAVENOUS | Status: DC | PRN
  Administered 2022-03-02: 50 mg via INTRAVENOUS
  Administered 2022-03-02: 20 mg via INTRAVENOUS

## 2022-03-02 MED ORDER — FENTANYL CITRATE (PF) 100 MCG/2ML IJ SOLN
INTRAMUSCULAR | Status: AC
  Administered 2022-03-02: 25 ug via INTRAVENOUS
  Filled 2022-03-02: qty 2

## 2022-03-02 MED ORDER — GLYCOPYRROLATE 0.2 MG/ML IJ SOLN
INTRAMUSCULAR | Status: DC | PRN
  Administered 2022-03-02 (×2): .2 mg via INTRAVENOUS

## 2022-03-02 MED ORDER — ONDANSETRON HCL 4 MG/2ML IJ SOLN
INTRAMUSCULAR | Status: DC | PRN
  Administered 2022-03-02 (×2): 4 mg via INTRAVENOUS

## 2022-03-02 MED ORDER — CEFAZOLIN SODIUM-DEXTROSE 2-4 GM/100ML-% IV SOLN
INTRAVENOUS | Status: AC
  Filled 2022-03-02: qty 100

## 2022-03-02 MED ORDER — HYDROCODONE-ACETAMINOPHEN 5-325 MG PO TABS: 1.0000 | ORAL_TABLET | ORAL | 0 refills | Status: DC | PRN

## 2022-03-02 MED ORDER — BUPIVACAINE LIPOSOME 1.3 % IJ SUSP
INTRAMUSCULAR | Status: DC | PRN
  Administered 2022-03-02: 20 mL

## 2022-03-02 MED ORDER — DEXAMETHASONE SODIUM PHOSPHATE 10 MG/ML IJ SOLN
INTRAMUSCULAR | Status: DC | PRN
  Administered 2022-03-02: 10 mg via INTRAVENOUS

## 2022-03-02 MED ORDER — SODIUM CHLORIDE 0.9 % IR SOLN
Status: DC | PRN
  Administered 2022-03-02: 500 mL

## 2022-03-02 MED ORDER — GABAPENTIN 300 MG PO CAPS
ORAL_CAPSULE | ORAL | Status: AC
  Administered 2022-03-02: 300 mg via ORAL
  Filled 2022-03-02: qty 1

## 2022-03-02 MED ORDER — FENTANYL CITRATE (PF) 100 MCG/2ML IJ SOLN
INTRAMUSCULAR | Status: DC | PRN
  Administered 2022-03-02 (×3): 50 ug via INTRAVENOUS

## 2022-03-02 MED ORDER — CHLORHEXIDINE GLUCONATE 0.12 % MT SOLN
OROMUCOSAL | Status: AC
  Administered 2022-03-02: 15 mL via OROMUCOSAL
  Filled 2022-03-02: qty 15

## 2022-03-02 MED ORDER — ACETAMINOPHEN 10 MG/ML IV SOLN: 1000.0000 mg | Freq: Once | INTRAVENOUS | Status: DC | PRN

## 2022-03-02 MED ORDER — LIDOCAINE HCL (CARDIAC) PF 100 MG/5ML IV SOSY
PREFILLED_SYRINGE | INTRAVENOUS | Status: DC | PRN
  Administered 2022-03-02: 100 mg via INTRAVENOUS

## 2022-03-02 SURGICAL SUPPLY — 49 items
CANNULA REDUC XI 12-8 STAPL (CANNULA) ×2
CANNULA REDUCER 12-8 DVNC XI (CANNULA) ×1 IMPLANT
CATH REDDICK CHOLANGI 4FR 50CM (CATHETERS) IMPLANT
CLIP LIGATING HEMO O LOK GREEN (MISCELLANEOUS) ×2 IMPLANT
DERMABOND ADVANCED (GAUZE/BANDAGES/DRESSINGS) ×1
DERMABOND ADVANCED .7 DNX12 (GAUZE/BANDAGES/DRESSINGS) ×1 IMPLANT
DRAPE ARM DVNC X/XI (DISPOSABLE) ×4 IMPLANT
DRAPE COLUMN DVNC XI (DISPOSABLE) ×1 IMPLANT
DRAPE DA VINCI XI ARM (DISPOSABLE) ×8
DRAPE DA VINCI XI COLUMN (DISPOSABLE) ×2
ELECT CAUTERY BLADE 6.4 (BLADE) ×2 IMPLANT
ELECT REM PT RETURN 9FT ADLT (ELECTROSURGICAL) ×2
ELECTRODE REM PT RTRN 9FT ADLT (ELECTROSURGICAL) ×1 IMPLANT
GLOVE BIO SURGEON STRL SZ7 (GLOVE) ×8 IMPLANT
GOWN STRL REUS W/ TWL LRG LVL3 (GOWN DISPOSABLE) ×4 IMPLANT
GOWN STRL REUS W/TWL LRG LVL3 (GOWN DISPOSABLE) ×8
IRRIGATION STRYKERFLOW (MISCELLANEOUS) IMPLANT
IRRIGATOR STRYKERFLOW (MISCELLANEOUS) ×2
IV CATH ANGIO 12GX3 LT BLUE (NEEDLE) IMPLANT
KIT PINK PAD W/HEAD ARE REST (MISCELLANEOUS) ×2
KIT PINK PAD W/HEAD ARM REST (MISCELLANEOUS) ×1 IMPLANT
LABEL OR SOLS (LABEL) ×2 IMPLANT
MANIFOLD NEPTUNE II (INSTRUMENTS) ×2 IMPLANT
NEEDLE HYPO 22GX1.5 SAFETY (NEEDLE) ×2 IMPLANT
NS IRRIG 500ML POUR BTL (IV SOLUTION) ×2 IMPLANT
OBTURATOR OPTICAL STANDARD 8MM (TROCAR) ×2
OBTURATOR OPTICAL STND 8 DVNC (TROCAR) ×1
OBTURATOR OPTICALSTD 8 DVNC (TROCAR) ×1 IMPLANT
PACK LAP CHOLECYSTECTOMY (MISCELLANEOUS) ×2 IMPLANT
SEAL CANN UNIV 5-8 DVNC XI (MISCELLANEOUS) ×3 IMPLANT
SEAL XI 5MM-8MM UNIVERSAL (MISCELLANEOUS) ×6
SET TUBE SMOKE EVAC HIGH FLOW (TUBING) ×2 IMPLANT
SOLUTION ELECTROLUBE (MISCELLANEOUS) ×2 IMPLANT
SPIKE FLUID TRANSFER (MISCELLANEOUS) ×2 IMPLANT
SPONGE T-LAP 18X18 ~~LOC~~+RFID (SPONGE) ×2 IMPLANT
SPONGE T-LAP 4X18 ~~LOC~~+RFID (SPONGE) IMPLANT
STAPLER CANNULA SEAL DVNC XI (STAPLE) ×1 IMPLANT
STAPLER CANNULA SEAL XI (STAPLE) ×2
STOPCOCK 3 WAY MALE LL (IV SETS)
STOPCOCK 3WAY MALE LL (IV SETS) IMPLANT
SUT MNCRL AB 4-0 PS2 18 (SUTURE) ×2 IMPLANT
SUT VICRYL 0 AB UR-6 (SUTURE) ×4 IMPLANT
SYR 20ML LL LF (SYRINGE) ×2 IMPLANT
SYR 30ML LL (SYRINGE) ×1 IMPLANT
SYS BAG RETRIEVAL 10MM (BASKET) ×2
SYSTEM BAG RETRIEVAL 10MM (BASKET) ×1 IMPLANT
TRAP FLUID SMOKE EVACUATOR (MISCELLANEOUS) ×2 IMPLANT
WATER STERILE IRR 3000ML UROMA (IV SOLUTION) IMPLANT
WATER STERILE IRR 500ML POUR (IV SOLUTION) ×1 IMPLANT

## 2022-03-02 NOTE — Op Note (Signed)
Robotic assisted laparoscopic Cholecystectomy  Pre-operative Diagnosis: biliary colic  Post-operative Diagnosis: same  Procedure:  Robotic assisted laparoscopic Cholecystectomy  Surgeon: Sterling Big, MD FACS  Anesthesia: Gen. with endotracheal tube  Findings: Chronic Cholecystitis  Hepatic steatosis with very large liver BMI 41.6  Estimated Blood Loss: 5 cc       Specimens: Gallbladder           Complications: none   Procedure Details  The patient was seen again in the Holding Room. The benefits, complications, treatment options, and expected outcomes were discussed with the patient. The risks of bleeding, infection, recurrence of symptoms, failure to resolve symptoms, bile duct damage, bile duct leak, retained common bile duct stone, bowel injury, any of which could require further surgery and/or ERCP, stent, or papillotomy were reviewed with the patient. The likelihood of improving the patient's symptoms with return to their baseline status is good.  The patient and/or family concurred with the proposed plan, giving informed consent.  The patient was taken to Operating Room, identified  and the procedure verified as Laparoscopic Cholecystectomy.  A Time Out was held and the above information confirmed.  Prior to the induction of general anesthesia, antibiotic prophylaxis was administered. VTE prophylaxis was in place. General endotracheal anesthesia was then administered and tolerated well. After the induction, the abdomen was prepped with Chloraprep and draped in the sterile fashion. The patient was positioned in the supine position.  Cut down technique was used to enter the abdominal cavity and a Hasson trochar was placed after two vicryl stitches were anchored to the fascia. Pneumoperitoneum was then created with CO2 and tolerated well without any adverse changes in the patient's vital signs.  Three 8-mm ports were placed under direct vision. All skin incisions  were infiltrated  with a local anesthetic agent before making the incision and placing the trocars.   The patient was positioned  in reverse Trendelenburg, robot was brought to the surgical field and docked in the standard fashion.  We made sure all the instrumentation was kept indirect view at all times and that there were no collision between the arms. I scrubbed out and went to the console.  The gallbladder was identified, the fundus grasped and retracted cephalad. THe liver was very large with hepatic steatosis. Adhesions were lysed bluntly. The infundibulum was grasped and retracted laterally, exposing the peritoneum overlying the triangle of Calot. This was then divided and exposed in a blunt fashion. An extended critical view of the cystic duct and cystic artery was obtained.  The cystic duct was clearly identified and bluntly dissected.   Artery and duct were double clipped and divided. Using ICG cholangiography we visualize the cystic duct and CBD, no evidence of bile injuries observed. The gallbladder was taken from the gallbladder fossa in a retrograde fashion with the electrocautery.  Hemostasis was achieved with the electrocautery. nspection of the right upper quadrant was performed. No bleeding, bile duct injury or leak, or bowel injury was noted. Robotic instruments and robotic arms were undocked in the standard fashion.  I scrubbed back in.  The gallbladder was removed and placed in an Endocatch bag.   Pneumoperitoneum was released.  The periumbilical port site was closed with interrumpted 0 Vicryl sutures. 4-0 subcuticular Monocryl was used to close the skin. Dermabond was  applied.  The patient was then extubated and brought to the recovery room in stable condition. Sponge, lap, and needle counts were correct at closure and at the conclusion of the case.  Caroleen Hamman, MD, FACS

## 2022-03-02 NOTE — Discharge Instructions (Addendum)
Laparoscopic Cholecystectomy, Care After   These instructions give you information on caring for yourself after your procedure. Your doctor may also give you more specific instructions. Call your doctor if you have any problems or questions after your procedure.  HOME CARE  Change your bandages (dressings) as told by your doctor.  Keep the wound dry and clean. Wash the wound gently with soap and water. Pat the wound dry with a clean towel.  Do not take baths, swim, or use hot tubs for 2 weeks, or as told by your doctor.  Only take medicine as told by your doctor.  Eat a normal diet as told by your doctor.  Do not lift anything heavier than 10 pounds (4.5 kg) until your doctor says it is okay.  Do not play contact sports for 1 week, or as told by your doctor. GET HELP IF:  Your wound is red, puffy (swollen), or painful.  You have yellowish-white fluid (pus) coming from the wound.  You have fluid draining from the wound for more than 1 day.  You have a bad smell coming from the wound.  Your wound breaks open. GET HELP RIGHT AWAY IF:  You have trouble breathing.  You have chest pain.  You have a fever >101  You have pain in the shoulders (shoulder strap areas) that is getting worse.  You feel dizzy or pass out (faint).  You have severe belly (abdominal) pain.  You feel sick to your stomach (nauseous) or throw up (vomit) for more than 1 day.  AMBULATORY SURGERY  DISCHARGE INSTRUCTIONS   The drugs that you were given will stay in your system until tomorrow so for the next 24 hours you should not:  Drive an automobile Make any legal decisions Drink any alcoholic beverage   You may resume regular meals tomorrow.  Today it is better to start with liquids and gradually work up to solid foods.  You may eat anything you prefer, but it is better to start with liquids, then soup and crackers, and gradually work up to solid foods.   Please notify your doctor immediately if you have any  unusual bleeding, trouble breathing, redness and pain at the surgery site, drainage, fever, or pain not relieved by medication.    Additional Instructions: LEAVE GREEN ARMBAND ON FOR 4 DAYS       Please contact your physician with any problems or Same Day Surgery at [phone], Monday through Friday 6 am to 4 pm, or [place] [facility] number at [phone].

## 2022-03-02 NOTE — Interval H&P Note (Signed)
History and Physical Interval Note:  03/02/2022 7:25 AM  Kelly Haley  has presented today for surgery, with the diagnosis of biliary colie.  The various methods of treatment have been discussed with the patient and family. After consideration of risks, benefits and other options for treatment, the patient has consented to  Procedure(s): XI ROBOTIC ASSISTED LAPAROSCOPIC CHOLECYSTECTOMY (N/A) INDOCYANINE GREEN FLUORESCENCE IMAGING (ICG) (N/A) as a surgical intervention.  The patient's history has been reviewed, patient examined, no change in status, stable for surgery.  I have reviewed the patient's chart and labs.  Questions were answered to the patient's satisfaction.     Thang Flett F Nezzie Manera

## 2022-03-02 NOTE — Anesthesia Procedure Notes (Signed)
Procedure Name: Intubation Date/Time: 03/02/2022 7:41 AM  Performed by: Mohammed Kindle, CRNAPre-anesthesia Checklist: Patient identified, Emergency Drugs available, Suction available and Patient being monitored Patient Re-evaluated:Patient Re-evaluated prior to induction Oxygen Delivery Method: Circle system utilized Preoxygenation: Pre-oxygenation with 100% oxygen Induction Type: IV induction Ventilation: Mask ventilation without difficulty Laryngoscope Size: McGraph and 3 Grade View: Grade I Tube type: Oral Tube size: 7.0 mm Number of attempts: 1 Airway Equipment and Method: Stylet and Oral airway Placement Confirmation: ETT inserted through vocal cords under direct vision, positive ETCO2, breath sounds checked- equal and bilateral and CO2 detector Secured at: 21 cm Tube secured with: Tape Dental Injury: Teeth and Oropharynx as per pre-operative assessment

## 2022-03-02 NOTE — Transfer of Care (Signed)
Immediate Anesthesia Transfer of Care Note  Patient: Kelly Haley  Procedure(s) Performed: XI ROBOTIC ASSISTED LAPAROSCOPIC CHOLECYSTECTOMY INDOCYANINE GREEN FLUORESCENCE IMAGING (ICG)  Patient Location: PACU  Anesthesia Type:General  Level of Consciousness: awake, drowsy and patient cooperative  Airway & Oxygen Therapy: Patient Spontanous Breathing and Patient connected to face mask oxygen  Post-op Assessment: Report given to RN and Post -op Vital signs reviewed and stable  Post vital signs: Reviewed and stable  Last Vitals:  Vitals Value Taken Time  BP 160/94 03/02/22 0900  Temp 36.2 C 03/02/22 0857  Pulse 109 03/02/22 0901  Resp 20 03/02/22 0901  SpO2 97 % 03/02/22 0901  Vitals shown include unvalidated device data.  Last Pain:  Vitals:   03/02/22 0621  TempSrc: Temporal  PainSc: 4          Complications: No notable events documented.

## 2022-03-02 NOTE — Interval H&P Note (Signed)
History and Physical Interval Note:  03/02/2022 7:19 AM  Kelly Haley  has presented today for surgery, with the diagnosis of biliary colie.  The various methods of treatment have been discussed with the patient and family. After consideration of risks, benefits and other options for treatment, the patient has consented to  Procedure(s): XI ROBOTIC ASSISTED LAPAROSCOPIC CHOLECYSTECTOMY (N/A) INDOCYANINE GREEN FLUORESCENCE IMAGING (ICG) (N/A) as a surgical intervention.  The patient's history has been reviewed, patient examined, no change in status, stable for surgery.  I have reviewed the patient's chart and labs.  Questions were answered to the patient's satisfaction.     Humberto Addo F Ariv Penrod

## 2022-03-02 NOTE — Anesthesia Preprocedure Evaluation (Signed)
Anesthesia Evaluation  Patient identified by MRN, date of birth, ID band Patient awake    Reviewed: Allergy & Precautions, NPO status , Patient's Chart, lab work & pertinent test results  History of Anesthesia Complications Negative for: history of anesthetic complications  Airway Mallampati: I  TM Distance: >3 FB Neck ROM: Full    Dental no notable dental hx. (+) Teeth Intact   Pulmonary neg pulmonary ROS, neg sleep apnea, neg COPD, Patient abstained from smoking.Not current smoker,    Pulmonary exam normal breath sounds clear to auscultation       Cardiovascular Exercise Tolerance: Good METS(-) hypertension(-) CAD and (-) Past MI negative cardio ROS  (-) dysrhythmias  Rhythm:Regular Rate:Normal - Systolic murmurs    Neuro/Psych  Headaches, PSYCHIATRIC DISORDERS Anxiety    GI/Hepatic GERD  Medicated,(+)     (-) substance abuse  ,   Endo/Other  neg diabetesMorbid obesity  Renal/GU negative Renal ROS     Musculoskeletal   Abdominal (+) + obese,   Peds  Hematology   Anesthesia Other Findings Past Medical History: No date: Anxiety No date: Appendicitis No date: Chest pain No date: Dental infection No date: Flank pain No date: Headache No date: Hearing loss No date: Hypokalemia No date: Lumbar pain No date: Mass of right breast No date: Medical history non-contributory No date: Muscle strain No date: Near syncope No date: Pharyngitis No date: Pulsatile tinnitus of right ear No date: Sensorineural hearing loss No date: Urinary frequency No date: UTI (urinary tract infection) No date: Weakness  Reproductive/Obstetrics                             Anesthesia Physical Anesthesia Plan  ASA: 3  Anesthesia Plan: General   Post-op Pain Management: Tylenol PO (pre-op)*, Gabapentin PO (pre-op)* and Celebrex PO (pre-op)*   Induction: Intravenous  PONV Risk Score and Plan: 4 or  greater and Ondansetron, Dexamethasone and Midazolam  Airway Management Planned: Oral ETT  Additional Equipment: None  Intra-op Plan:   Post-operative Plan: Extubation in OR  Informed Consent: I have reviewed the patients History and Physical, chart, labs and discussed the procedure including the risks, benefits and alternatives for the proposed anesthesia with the patient or authorized representative who has indicated his/her understanding and acceptance.     Dental advisory given  Plan Discussed with: CRNA and Surgeon  Anesthesia Plan Comments: (Discussed risks of anesthesia with patient, including PONV, sore throat, lip/dental/eye damage. Rare risks discussed as well, such as cardiorespiratory and neurological sequelae, and allergic reactions. Discussed the role of CRNA in patient's perioperative care. Patient understands.)        Anesthesia Quick Evaluation

## 2022-03-02 NOTE — Anesthesia Postprocedure Evaluation (Signed)
Anesthesia Post Note  Patient: DIASIA HENKEN  Procedure(s) Performed: XI ROBOTIC ASSISTED LAPAROSCOPIC CHOLECYSTECTOMY INDOCYANINE GREEN FLUORESCENCE IMAGING (ICG)  Patient location during evaluation: PACU Anesthesia Type: General Level of consciousness: awake and alert Pain management: pain level controlled Vital Signs Assessment: post-procedure vital signs reviewed and stable Respiratory status: spontaneous breathing, nonlabored ventilation, respiratory function stable and patient connected to nasal cannula oxygen Cardiovascular status: blood pressure returned to baseline and stable Postop Assessment: no apparent nausea or vomiting Anesthetic complications: no   No notable events documented.   Last Vitals:  Vitals:   03/02/22 1030 03/02/22 1054  BP: 109/72 108/84  Pulse: 71 66  Resp: 16 18  Temp: 36.6 C 36.8 C  SpO2: 93% 99%    Last Pain:  Vitals:   03/02/22 1054  TempSrc: Temporal  PainSc: 5                  Corinda Gubler

## 2022-03-03 LAB — SURGICAL PATHOLOGY

## 2022-03-16 ENCOUNTER — Encounter: Payer: Self-pay | Admitting: Physician Assistant

## 2022-03-16 ENCOUNTER — Other Ambulatory Visit: Payer: Self-pay

## 2022-03-16 ENCOUNTER — Ambulatory Visit (INDEPENDENT_AMBULATORY_CARE_PROVIDER_SITE_OTHER): Payer: Self-pay | Admitting: Physician Assistant

## 2022-03-16 VITALS — BP 112/72 | HR 64 | Temp 98.2°F | Ht 64.0 in | Wt 234.4 lb

## 2022-03-16 DIAGNOSIS — K8010 Calculus of gallbladder with chronic cholecystitis without obstruction: Secondary | ICD-10-CM

## 2022-03-16 DIAGNOSIS — Z09 Encounter for follow-up examination after completed treatment for conditions other than malignant neoplasm: Secondary | ICD-10-CM

## 2022-03-16 DIAGNOSIS — K8020 Calculus of gallbladder without cholecystitis without obstruction: Secondary | ICD-10-CM

## 2022-03-16 NOTE — Patient Instructions (Signed)

## 2022-03-16 NOTE — Progress Notes (Signed)
[facility] POST-OP OFFICE VISIT  03/16/2022  HPI: Kelly Haley is a 41 y.o. female 14 days s/p robotic assisted laparoscopic cholecystectomy for biliary colic with Dr Everlene Farrier   She is doing well Pain for about 5 days but that subsided quickly She has had about 3 episodes of diarrhea but is limiting her fatty foods No fever, chills, nausea, emesis No issues with incisions No other complaints  Vital signs: BP 112/72   Pulse 64   Temp 98.2 F (36.8 C) (Oral)   Ht 5\' 4"  (1.626 m)   Wt 234 lb 6.4 oz (106.3 kg)   LMP 02/20/2022   SpO2 99%   BMI 40.23 kg/m    Physical Exam: Constitutional: Well appearing female, NAD Abdomen: Soft, non-tender, non-distended, no rebound/guarding Skin: Laparoscopic incisions are healing well, no erythema or drainage   Assessment/Plan: This is a 41 y.o. female 14 days s/p robotic assisted laparoscopic cholecystectomy for biliary colic with Dr 55    - Pain control prn  - Reviewed wound care recommendation  - Reviewed lifting restrictions; 4 weeks total  - Reviewed surgical pathology; CCC  - She can follow up on as needed basis; She understands to call with questions/concerns  -- Everlene Farrier, PA-C [facility] 03/16/2022, 1:47 PM M-F: 7am - 4pm

## 2022-03-30 ENCOUNTER — Encounter: Payer: Self-pay | Admitting: Physician Assistant

## 2022-09-01 ENCOUNTER — Telehealth: Payer: Self-pay | Admitting: Surgery

## 2022-09-01 NOTE — Telephone Encounter (Signed)
Patient had robotic cholecystectomy done on 03/02/22 with Dr. Dahlia Byes.  Patient states that when she initially saw Dr. Dahlia Byes was having chest pain and soreness in the chest which led to her having the surgery.  Patient states that over the last two weeks she is experiencing same symptoms, chest pain and soreness in the chest area. She does have an appointment with her primary care doctor Friday February 16th 2024.  She is encouraged to keep this appointment.  But in the interim is wondering if the surgery could have caused her to start having these symptoms.  Please call her. Thank you.

## 2022-09-01 NOTE — Telephone Encounter (Signed)
Message left for the patient to call the office back to schedule a follow up with Dr Dahlia Byes.

## 2022-09-05 ENCOUNTER — Encounter (HOSPITAL_COMMUNITY): Payer: Self-pay

## 2022-09-05 ENCOUNTER — Ambulatory Visit (HOSPITAL_COMMUNITY)
Admission: EM | Admit: 2022-09-05 | Discharge: 2022-09-05 | Disposition: A | Discharge status: Home / Self Care | Payer: No Typology Code available for payment source

## 2022-09-05 DIAGNOSIS — R350 Frequency of micturition: Secondary | ICD-10-CM | POA: Insufficient documentation

## 2022-09-05 DIAGNOSIS — R1031 Right lower quadrant pain: Secondary | ICD-10-CM | POA: Insufficient documentation

## 2022-09-05 LAB — POCT URINALYSIS DIPSTICK, ED / UC
Bilirubin Urine: NEGATIVE
Glucose, UA: NEGATIVE mg/dL
Hgb urine dipstick: NEGATIVE
Ketones, ur: NEGATIVE mg/dL
Leukocytes,Ua: NEGATIVE
Nitrite: NEGATIVE
Protein, ur: NEGATIVE mg/dL
Specific Gravity, Urine: 1.02 (ref 1.005–1.030)
Urobilinogen, UA: 2 mg/dL — ABNORMAL HIGH (ref 0.0–1.0)
pH: 8.5 — ABNORMAL HIGH (ref 5.0–8.0)

## 2022-09-05 MED ORDER — ONDANSETRON 8 MG PO TBDP: 8.0000 mg | ORAL_TABLET | Freq: Three times a day (TID) | ORAL | 0 refills | Status: AC | PRN

## 2022-09-05 NOTE — ED Triage Notes (Signed)
Possible uti, Patient having left flank pain into the lower abdomen, nausea, urinary urgency with decreased out put. X3 days. No home interventions for pain

## 2022-09-05 NOTE — ED Provider Notes (Signed)
[facility]    CSN: [id number] Arrival date & time: 09/05/22  1242      History   Chief Complaint Chief Complaint  Patient presents with   Urinary Tract Infection    HPI Kelly Haley is a 42 y.o. female.   Patient reports left flank pain that radiates into her lower abdomen, nausea, urgency and overall decreased urine output for the past 3 days.  She denies fevers, emesis, diarrhea, cough, chest pain, shortness of breath.  Reports she does not drink sodas or teas, reports good water intake.  Denies hematuria, or vaginal complaints, discharge, sores or lesions.    The history is provided by the patient.  Urinary Tract Infection Associated symptoms: abdominal pain, flank pain and nausea   Associated symptoms: no fever and no vomiting     Past Medical History:  Diagnosis Date   Anxiety    Appendicitis    Chest pain    Dental infection    Flank pain    Headache    Hearing loss    Hypokalemia    Lumbar pain    Mass of right breast    Medical history non-contributory    Muscle strain    Near syncope    Pharyngitis    Pulsatile tinnitus of right ear    Sensorineural hearing loss    Urinary frequency    UTI (urinary tract infection)    Weakness     Patient Active Problem List   Diagnosis Date Noted   Near syncope 09/04/2021   Chest pain of uncertain etiology [id number]   Palpitations 09/04/2021   Appendicitis 09/06/2018    Past Surgical History:  Procedure Laterality Date   APPENDECTOMY  09/06/2018   CARPAL TUNNEL RELEASE Left    CARPAL TUNNEL RELEASE Right    LAPAROSCOPIC APPENDECTOMY N/A 09/06/2018   Procedure: APPENDECTOMY LAPAROSCOPIC;  Surgeon: Ralene Ok, MD;  Location: [facility];  Service: General;  Laterality: N/A;   TUBAL LIGATION      OB History     Gravida  4   Para  3   Term  3   Preterm      AB  1   Living  3      SAB      IAB  1   Ectopic      Multiple      Live Births               Home  Medications    Prior to Admission medications   Medication Sig Start Date End Date Taking? Authorizing Provider  escitalopram (LEXAPRO) 10 MG tablet Take 10 mg by mouth at bedtime.   Yes [provider]  famotidine (PEPCID) 40 MG tablet Take 40 mg by mouth daily.   Yes [provider]  ondansetron (ZOFRAN-ODT) 8 MG disintegrating tablet Take 1 tablet (8 mg total) by mouth every 8 (eight) hours as needed for nausea or vomiting. 09/05/22  Yes Darbie Biancardi, [place] N, FNP    Family History Family History  Problem Relation Age of Onset   Colon cancer Father     Social History Social History   Tobacco Use   Smoking status: Never    Passive exposure: Past   Smokeless tobacco: Never  Vaping Use   Vaping Use: Never used  Substance Use Topics   Alcohol use: Yes    Comment: occ.   Drug use: No     Allergies   Penicillins   Review of Systems  Review of Systems  Constitutional:  Negative for chills, fatigue and fever.  HENT:  Negative for sore throat.   Respiratory:  Negative for cough and shortness of breath.   Cardiovascular:  Negative for chest pain.  Gastrointestinal:  Positive for abdominal pain and nausea. Negative for diarrhea and vomiting.  Genitourinary:  Positive for decreased urine volume, flank pain, frequency and urgency. Negative for dysuria, hematuria and vaginal pain.     Physical Exam Triage Vital Signs ED Triage Vitals  Enc Vitals Group     BP 09/05/22 1429 124/80     Pulse Rate 09/05/22 1429 75     Resp 09/05/22 1429 16     Temp 09/05/22 1429 98.4 F (36.9 C)     Temp Source 09/05/22 1429 Oral     SpO2 09/05/22 1429 98 %     Weight 09/05/22 1428 220 lb (99.8 kg)     Height 09/05/22 1428 5' 4"$  (1.626 m)     Head Circumference --      Peak Flow --      Pain Score 09/05/22 1427 5     Pain Loc --      Pain Edu? --      Excl. in [place]? --    No data found.  Updated Vital Signs BP 124/80 (BP Location: Left Arm)   Pulse 75   Temp 98.4 F  (36.9 C) (Oral)   Resp 16   Ht 5' 4"$  (1.626 m)   Wt 220 lb (99.8 kg)   LMP 08/29/2022 (Approximate)   SpO2 98%   BMI 37.76 kg/m   Visual Acuity Right Eye Distance:   Left Eye Distance:   Bilateral Distance:    Right Eye Near:   Left Eye Near:    Bilateral Near:     Physical Exam Vitals and nursing note reviewed.  Constitutional:      General: She is not in acute distress.    Appearance: She is well-developed.     Comments: Pleasant 42 year old female who appears stated age.  HENT:     Head: Normocephalic and atraumatic.     Right Ear: External ear normal.     Left Ear: External ear normal.  Eyes:     Conjunctiva/sclera: Conjunctivae normal.  Cardiovascular:     Rate and Rhythm: Normal rate and regular rhythm.     Heart sounds: Normal heart sounds, S1 normal and S2 normal. No murmur heard. Pulmonary:     Effort: Pulmonary effort is normal. No respiratory distress.     Breath sounds: Normal breath sounds.     Comments: Lungs vesicular posteriorly. Abdominal:     General: Abdomen is flat. Bowel sounds are normal.     Palpations: Abdomen is soft.     Tenderness: There is abdominal tenderness in the right lower quadrant. There is right CVA tenderness and left CVA tenderness.     Comments: Mild right lower quadrant abdominal tenderness.  Previous appendectomy and gallbladder removal.  Musculoskeletal:        General: No swelling.     Cervical back: Neck supple.     Comments: Bilateral CVA tenderness.  Skin:    General: Skin is warm and dry.     Capillary Refill: Capillary refill takes less than 2 seconds.  Neurological:     Mental Status: She is alert.  Psychiatric:        Mood and Affect: Mood normal.        Behavior: Behavior is cooperative.  UC Treatments / Results  Labs (all labs ordered are listed, but only abnormal results are displayed) Labs Reviewed  POCT URINALYSIS DIPSTICK, ED / UC - Abnormal; Notable for the following components:      Result  Value   pH 8.5 (*)    Urobilinogen, UA 2.0 (*)    All other components within normal limits  URINE CULTURE    EKG   Radiology No results found.  Procedures Procedures (including critical care time)  Medications Ordered in UC Medications - No data to display  Initial Impression / Assessment and Plan / UC Course  I have reviewed the triage vital signs and the nursing notes.  Pertinent labs & imaging results that were available during my care of the patient were reviewed by me and considered in my medical decision making (see chart for details).   Vital signs and nursing note reviewed, patient is hemodynamically stable.  Afebrile.  Bilateral CVA tenderness, urine dipstick in clinic negative for acute infection, will send for culture.  Mild right lower quadrant abdominal tenderness, patient with previous appendectomy and gallbladder removal.  Patient appears stable in clinic, discussed treating nausea with ODT Zofran, discussed there are many causes of abdominal pain, but her condition does not appear emergent at this time.  Return precautions and follow-up care discussed, patient verbalized understanding.     Final Clinical Impressions(s) / UC Diagnoses   Final diagnoses:  Urinary frequency  Right lower quadrant abdominal pain     Discharge Instructions      Overall your physical exam is reassuring.  Your urinalysis was without signs of infection, we will send this off for culture and call if we need to initiate antibiotic treatment.  For your nausea you can take Zofran 8 mg every 8 hours as needed.  There are many causes of abdominal pain, but I feel confident that you are not suffering from any emergent condition at this point.  Please seek immediate care if you develop worsening of pain, recurrent emesis, fevers, or worsening of symptoms.     ED Prescriptions     Medication Sig Dispense Auth. Provider   ondansetron (ZOFRAN-ODT) 8 MG disintegrating tablet Take 1 tablet  (8 mg total) by mouth every 8 (eight) hours as needed for nausea or vomiting. 20 tablet Gwen Edler, [place] N, [place]      I have reviewed the PDMP during this encounter.   Danyal Whitenack, [place] N, [place] 09/05/22 1505

## 2022-09-05 NOTE — Discharge Instructions (Addendum)
Overall your physical exam is reassuring.  Your urinalysis was without signs of infection, we will send this off for culture and call if we need to initiate antibiotic treatment.  For your nausea you can take Zofran 8 mg every 8 hours as needed.  There are many causes of abdominal pain, but I feel confident that you are not suffering from any emergent condition at this point.  Please seek immediate care if you develop worsening of pain, recurrent emesis, fevers, or worsening of symptoms.

## 2022-09-06 LAB — URINE CULTURE: Culture: 10000 — AB

## 2022-09-09 ENCOUNTER — Encounter (HOSPITAL_COMMUNITY): Payer: Self-pay

## 2022-10-26 ENCOUNTER — Other Ambulatory Visit: Payer: Self-pay

## 2022-10-26 ENCOUNTER — Emergency Department (HOSPITAL_COMMUNITY)
Admission: EM | Admit: 2022-10-26 | Discharge: 2022-10-26 | Disposition: A | Discharge status: Home / Self Care | Payer: Self-pay | Attending: Emergency Medicine | Admitting: Emergency Medicine

## 2022-10-26 DIAGNOSIS — J069 Acute upper respiratory infection, unspecified: Secondary | ICD-10-CM | POA: Insufficient documentation

## 2022-10-26 DIAGNOSIS — Z1152 Encounter for screening for COVID-19: Secondary | ICD-10-CM | POA: Insufficient documentation

## 2022-10-26 LAB — RESP PANEL BY RT-PCR (RSV, FLU A&B, COVID)  RVPGX2
Influenza A by PCR: NEGATIVE
Influenza B by PCR: NEGATIVE
Resp Syncytial Virus by PCR: NEGATIVE
SARS Coronavirus 2 by RT PCR: NEGATIVE

## 2022-10-26 LAB — GROUP A STREP BY PCR: Group A Strep by PCR: NOT DETECTED

## 2022-10-26 MED ORDER — FLUTICASONE PROPIONATE 50 MCG/ACT NA SUSP: 2.0000 | Freq: Every day | NASAL | 0 refills | Status: AC

## 2022-10-26 NOTE — ED Provider Notes (Signed)
[facility] EMERGENCY DEPARTMENT AT [facility] Provider Note   CSN: [id number] Arrival date & time: 10/26/22  2011     History Chief Complaint  Patient presents with   Otalgia        Sore Throat    HPI Kelly Haley is a 42 y.o. female presenting for chief complaint of sore throat and earache.  States that her symptoms started today.  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  Otherwise healthy up-to-date on vaccines..   Patient's recorded medical, surgical, social, medication list and allergies were reviewed in the Snapshot window as part of the initial history.   Review of Systems   Review of Systems  Constitutional:  Negative for chills and fever.  HENT:  Positive for congestion and ear pain. Negative for sore throat.   Eyes:  Negative for pain and visual disturbance.  Respiratory:  Negative for cough and shortness of breath.   Cardiovascular:  Negative for chest pain and palpitations.  Gastrointestinal:  Negative for abdominal pain and vomiting.  Genitourinary:  Negative for dysuria and hematuria.  Musculoskeletal:  Negative for arthralgias and back pain.  Skin:  Negative for color change and rash.  Neurological:  Negative for seizures and syncope.  All other systems reviewed and are negative.   Physical Exam Updated Vital Signs BP (!) 135/91 (BP Location: Right Arm)   Pulse 80   Temp 98.2 F (36.8 C) (Oral)   Resp 16   Ht 5\' 4"  (1.626 m)   Wt 104.3 kg   LMP 10/19/2022 (Approximate)   SpO2 100%   BMI 39.48 kg/m  Physical Exam Vitals and nursing note reviewed.  Constitutional:      General: She is not in acute distress.    Appearance: She is well-developed.  HENT:     Head: Normocephalic and atraumatic.  Eyes:     Conjunctiva/sclera: Conjunctivae normal.  Cardiovascular:     Rate and Rhythm: Normal rate and regular rhythm.     Heart sounds: No murmur heard. Pulmonary:     Effort: Pulmonary effort is normal.  No respiratory distress.     Breath sounds: Normal breath sounds.  Abdominal:     General: There is no distension.     Palpations: Abdomen is soft.     Tenderness: There is no abdominal tenderness. There is no right CVA tenderness or left CVA tenderness.  Musculoskeletal:        General: No swelling or tenderness. Normal range of motion.     Cervical back: Neck supple.  Skin:    General: Skin is warm and dry.  Neurological:     General: No focal deficit present.     Mental Status: She is alert and oriented to person, place, and time. Mental status is at baseline.     Cranial Nerves: No cranial nerve deficit.      ED Course/ Medical Decision Making/ A&P    Procedures Procedures   Medications Ordered in ED Medications - No data to display Medical Decision Making:   Kelly Haley is a 42 y.o. female who presented to the ED today with subjective fever, cough, congestion detailed above.    Complete initial physical exam performed, notably the patient  was hemodynamically stable in no acute distress.  Posterior oropharynx illuminated and without obvious swelling or deformity.  Patient is without neck stiffness.    Reviewed and confirmed nursing documentation for past medical history, family history, social history.  Initial Assessment:   With the patient's presentation of fever cough congestion, most likely diagnosis is developing viral upper respiratory infection. Other diagnoses were considered including (but not limited to) peritonsillar abscess, retropharyngeal abscess, pneumonia. These are considered less likely due to history of present illness and physical exam findings.   This is most consistent with an acute complicated illness Considered meningitis, however patient's symptoms, vital signs, physical exam findings including lack of meningismus seem grossly less consistent at this time. Initial Plan:  Viral screening including COVID/flu testing to evaluate for common  viral etiologies that need to be tracked Rapid Strep test Objective evaluation as below reviewed   Initial Study Results:   Laboratory  All laboratory results reviewed without evidence of clinically relevant pathology.      Final Assessment and Plan:   On reassessment, patient is ambulatory tolerating p.o. intake in no acute distress.   Patient's COVID test is negative. Patient is currently stable for outpatient care and management with no indication for hospitalization or transfer at this time.  Discussed all findings with patient expressed understanding.  Disposition:  Based on the above findings, I believe patient is stable for discharge.    Patient/family educated about specific return precautions for given chief complaint and symptoms.  Patient/family educated about follow-up with PCP.     Patient/family expressed understanding of return precautions and need for follow-up. Patient spoken to regarding all imaging and laboratory results and appropriate follow up for these results. All education provided in verbal form with additional information in written form. Time was allowed for answering of patient questions. Patient discharged.    Emergency Department Medication Summary:   Medications - No data to display         Clinical Impression:  1. Viral upper respiratory tract infection      Discharge    Clinical Impression:  1. Viral upper respiratory tract infection      Discharge   Final Clinical Impression(s) / ED Diagnoses Final diagnoses:  Viral upper respiratory tract infection    Rx / DC Orders ED Discharge Orders          Ordered    fluticasone (FLONASE) 50 MCG/ACT nasal spray  Daily        10/26/22 2219              Glyn Ade, MD 10/26/22 2223

## 2022-10-26 NOTE — ED Triage Notes (Signed)
Pt c/o bilateral ear pain and sore throat on and off for the past few days, worse tonight. No meds PTA. Denies sick contacts.

## 2023-02-20 ENCOUNTER — Other Ambulatory Visit: Payer: Self-pay

## 2023-02-20 ENCOUNTER — Encounter (HOSPITAL_COMMUNITY): Payer: Self-pay

## 2023-02-20 ENCOUNTER — Emergency Department (HOSPITAL_COMMUNITY): Payer: Self-pay

## 2023-02-20 ENCOUNTER — Emergency Department (HOSPITAL_COMMUNITY)
Admission: EM | Admit: 2023-02-20 | Discharge: 2023-02-20 | Disposition: A | Discharge status: Home / Self Care | Payer: Self-pay | Attending: Emergency Medicine | Admitting: Emergency Medicine

## 2023-02-20 DIAGNOSIS — R519 Headache, unspecified: Secondary | ICD-10-CM | POA: Insufficient documentation

## 2023-02-20 DIAGNOSIS — R42 Dizziness and giddiness: Secondary | ICD-10-CM | POA: Insufficient documentation

## 2023-02-20 DIAGNOSIS — H538 Other visual disturbances: Secondary | ICD-10-CM | POA: Insufficient documentation

## 2023-02-20 LAB — BASIC METABOLIC PANEL
Anion gap: 8 (ref 5–15)
BUN: 15 mg/dL (ref 6–20)
CO2: 23 mmol/L (ref 22–32)
Calcium: 9 mg/dL (ref 8.9–10.3)
Chloride: 108 mmol/L (ref 98–111)
Creatinine, Ser: 0.78 mg/dL (ref 0.44–1.00)
GFR, Estimated: >60 mL/min (ref >60)
Glucose, Bld: 111 mg/dL — ABNORMAL HIGH (ref 70–99)
Potassium: 3.3 mmol/L — ABNORMAL LOW (ref 3.5–5.1)
Sodium: 139 mmol/L (ref 135–145)

## 2023-02-20 LAB — CBC
HCT: 43.2 % (ref 36.0–46.0)
Hemoglobin: 13.8 g/dL (ref 12.0–15.0)
MCH: 27.7 pg (ref 26.0–34.0)
MCHC: 31.9 g/dL (ref 30.0–36.0)
MCV: 86.7 fL (ref 80.0–100.0)
Platelets: 341 10*3/uL (ref 150–400)
RBC: 4.98 MIL/uL (ref 3.87–5.11)
RDW: 13 % (ref 11.5–15.5)
WBC: 7.9 10*3/uL (ref 4.0–10.5)
nRBC: 0 % (ref 0.0–0.2)

## 2023-02-20 LAB — HCG, SERUM, QUALITATIVE: Preg, Serum: NEGATIVE

## 2023-02-20 LAB — CBG MONITORING, ED: Glucose-Capillary: 99 mg/dL (ref 70–99)

## 2023-02-20 MED ORDER — SODIUM CHLORIDE 0.9 % IV SOLN: INTRAVENOUS | Status: DC

## 2023-02-20 MED ORDER — RIZATRIPTAN BENZOATE 10 MG PO TBDP: 10.0000 mg | ORAL_TABLET | ORAL | 0 refills | Status: DC | PRN

## 2023-02-20 MED ORDER — RIZATRIPTAN BENZOATE 10 MG PO TBDP: 10.0000 mg | ORAL_TABLET | ORAL | 0 refills | Status: AC | PRN

## 2023-02-20 MED ORDER — KETOROLAC TROMETHAMINE 15 MG/ML IJ SOLN
15.0000 mg | Freq: Once | INTRAMUSCULAR | Status: AC
  Administered 2023-02-20: 15 mg via INTRAVENOUS
  Filled 2023-02-20: qty 1

## 2023-02-20 MED ORDER — SODIUM CHLORIDE 0.9 % IV BOLUS
1000.0000 mL | Freq: Once | INTRAVENOUS | Status: AC
  Administered 2023-02-20: 1000 mL via INTRAVENOUS

## 2023-02-20 NOTE — ED Triage Notes (Signed)
Pt reports x2 weeks of head pressure with dizziness and blurry vision. Denies related hx or injuries.

## 2023-02-20 NOTE — ED Provider Notes (Signed)
[place] EMERGENCY DEPARTMENT AT [facility] Provider Note   CSN: [id number] Arrival date & time: 02/20/23  1526     History  Chief Complaint  Patient presents with   Headache   Blurred Vision   Dizziness    Kelly Haley is a 42 y.o. female.  Pt is 42 yo female with pmhx significant for anxiety.  Pt said she's had a headache with pressure and dizziness for 2 weeks.  Pt denies any n/v.  No trauma.  She drove here.       Home Medications Prior to Admission medications   Medication Sig Start Date End Date Taking? Authorizing Provider  rizatriptan (MAXALT-MLT) 10 MG disintegrating tablet Take 1 tablet (10 mg total) by mouth as needed for migraine. May repeat in 2 hours if needed 02/20/23  Yes Jacalyn Lefevre, MD  escitalopram (LEXAPRO) 10 MG tablet Take 10 mg by mouth at bedtime.    [provider]  famotidine (PEPCID) 40 MG tablet Take 40 mg by mouth daily.    [provider]  fluticasone (FLONASE) 50 MCG/ACT nasal spray Place 2 sprays into both nostrils daily. 10/26/22   Glyn Ade, MD  ondansetron (ZOFRAN-ODT) 8 MG disintegrating tablet Take 1 tablet (8 mg total) by mouth every 8 (eight) hours as needed for nausea or vomiting. 09/05/22   [place], [place] N, FNP      Allergies    Penicillins    Review of Systems   Review of Systems  Neurological:  Positive for dizziness, weakness and headaches.  All other systems reviewed and are negative.   Physical Exam Updated Vital Signs BP 132/82   Pulse 69   Temp 98.5 F (36.9 C) (Oral)   Resp 17   Ht 5\' 4"  (1.626 m)   Wt 104.3 kg   SpO2 98%   BMI 39.48 kg/m  Physical Exam Vitals and nursing note reviewed.  Constitutional:      Appearance: She is well-developed.  HENT:     Head: Normocephalic and atraumatic.     Mouth/Throat:     Mouth: Mucous membranes are moist.     Pharynx: Oropharynx is clear.  Eyes:     Extraocular Movements: Extraocular movements intact.     Pupils:  Pupils are equal, round, and reactive to light.  Cardiovascular:     Rate and Rhythm: Normal rate and regular rhythm.     Heart sounds: Normal heart sounds.  Pulmonary:     Effort: Pulmonary effort is normal.     Breath sounds: Normal breath sounds.  Abdominal:     General: Bowel sounds are normal.     Palpations: Abdomen is soft.  Musculoskeletal:        General: Normal range of motion.     Cervical back: Normal range of motion and neck supple.  Skin:    General: Skin is warm.     Capillary Refill: Capillary refill takes less than 2 seconds.  Neurological:     Mental Status: She is alert and oriented to person, place, and time.  Psychiatric:        Mood and Affect: Mood normal.        Speech: Speech normal.        Behavior: Behavior normal.     ED Results / Procedures / Treatments   Labs (all labs ordered are listed, but only abnormal results are displayed) Labs Reviewed  BASIC METABOLIC PANEL - Abnormal; Notable for the following components:  Result Value   Potassium 3.3 (*)    Glucose, Bld 111 (*)    All other components within normal limits  HCG, SERUM, QUALITATIVE  CBC  URINALYSIS, ROUTINE W REFLEX MICROSCOPIC  CBG MONITORING, ED    EKG EKG Interpretation Date/Time:  Sunday February 20 2023 15:54:31 EDT Ventricular Rate:  87 PR Interval:  134 QRS Duration:  92 QT Interval:  348 QTC Calculation: 419 R Axis:   54  Text Interpretation: Sinus rhythm Borderline repolarization abnormality No significant change since last tracing Confirmed by Jacalyn Lefevre [phone]) on 02/20/2023 4:11:30 PM  Radiology CT HEAD WO CONTRAST  Result Date: 02/20/2023 CLINICAL DATA:  Headache, sudden, severe EXAM: CT HEAD WITHOUT CONTRAST TECHNIQUE: Contiguous axial images were obtained from the base of the skull through the vertex without intravenous contrast. RADIATION DOSE REDUCTION: This exam was performed according to the departmental dose-optimization program which includes  automated exposure control, adjustment of the mA and/or kV according to patient size and/or use of iterative reconstruction technique. COMPARISON:  CT Head 02/14/16 FINDINGS: Brain: No evidence of acute infarction, hemorrhage, hydrocephalus, extra-axial collection or mass lesion/mass effect. Vascular: No hyperdense vessel or unexpected calcification. Skull: Normal. Negative for fracture or focal lesion. Sinuses/Orbits: No middle ear or mastoid effusion. Paranasal sinuses clear. Orbits are. Other: None. IMPRESSION: No CT etiology for headaches identified. Electronically Signed   By: Lorenza Cambridge M.D.   On: 02/20/2023 16:59    Procedures Procedures    Medications Ordered in ED Medications  sodium chloride 0.9 % bolus 1,000 mL (1,000 mLs Intravenous New Bag/Given 02/20/23 1620)    And  0.9 %  sodium chloride infusion ( Intravenous New Bag/Given 02/20/23 1620)  ketorolac (TORADOL) 15 MG/ML injection 15 mg (15 mg Intravenous Given 02/20/23 1620)    ED Course/ Medical Decision Making/ A&P                                 Medical Decision Making Amount and/or Complexity of Data Reviewed Labs: ordered. Radiology: ordered.  Risk Prescription drug management.   This patient presents to the ED for concern of headache, this involves an extensive number of treatment options, and is a complaint that carries with it a high risk of complications and morbidity.  The differential diagnosis includes tumor, electrolyte abn, covid   Co morbidities that complicate the patient evaluation  anxiety   Additional history obtained:  Additional history obtained from epic chart review  Lab Tests:  I Ordered, and personally interpreted labs.  The pertinent results include:  cbc nl, bmp nl, preg neg   Imaging Studies ordered:  I ordered imaging studies including ct head  I independently visualized and interpreted imaging which showed No CT etiology for headaches identified.  I agree with the radiologist  interpretation   Cardiac Monitoring:  The patient was maintained on a cardiac monitor.  I personally viewed and interpreted the cardiac monitored which showed an underlying rhythm of: nsr   Medicines ordered and prescription drug management:  I ordered medication including ivfs/toradol  for sx  Reevaluation of the patient after these medicines showed that the patient improved I have reviewed the patients home medicines and have made adjustments as needed   Test Considered:  ct   Critical Interventions:  Ivfs/meds   Problem List / ED Course:  Headache:  ct nl.  Labs nl.  Possible migraine.  Pt does feel better after tx.  Pt is  stable for d/c.  She is to f/u with neuro.  Return if worse.   Reevaluation:  After the interventions noted above, I reevaluated the patient and found that they have :improved   Social Determinants of Health:  No insurance   Dispostion:  After consideration of the diagnostic results and the patients response to treatment, I feel that the patent would benefit from discharge with outpatient f/u.          Final Clinical Impression(s) / ED Diagnoses Final diagnoses:  Acute nonintractable headache, unspecified headache type    Rx / DC Orders ED Discharge Orders          Ordered    Ambulatory referral to Neurology       Comments: An appointment is requested in approximately: 1 week   02/20/23 1736    rizatriptan (MAXALT-MLT) 10 MG disintegrating tablet  As needed        02/20/23 1738              Jacalyn Lefevre, MD 02/20/23 [phone]

## 2023-02-22 ENCOUNTER — Encounter (HOSPITAL_BASED_OUTPATIENT_CLINIC_OR_DEPARTMENT_OTHER): Payer: Self-pay

## 2023-02-22 DIAGNOSIS — G4710 Hypersomnia, unspecified: Secondary | ICD-10-CM

## 2023-02-22 DIAGNOSIS — R5383 Other fatigue: Secondary | ICD-10-CM

## 2023-03-23 ENCOUNTER — Ambulatory Visit: Payer: Self-pay | Admitting: Diagnostic Neuroimaging

## 2023-03-23 ENCOUNTER — Encounter: Payer: Self-pay | Admitting: Diagnostic Neuroimaging

## 2023-03-29 ENCOUNTER — Ambulatory Visit (HOSPITAL_BASED_OUTPATIENT_CLINIC_OR_DEPARTMENT_OTHER): Payer: Self-pay | Attending: Student | Admitting: Internal Medicine

## 2023-08-01 NOTE — Progress Notes (Signed)
 Office Visit Note   Patient: Kelly Haley           Date of Birth: 1981-02-02           MRN: [id number] Visit Date: 08/02/2023              Requested by: Desiree Quale, NP [address] [place],  [place] [address] PCP: Desiree Quale, NP   Assessment & Plan: Visit Diagnoses:  1. Neck pain   2. Chronic pain of both shoulders   3. Radiculopathy, cervical region     Plan: Patient presenting with cervical radiculopathy with bilateral upper extremity symptoms.  Patient's x-ray does show narrowing which is likely causing her symptoms.  At this time, we will go ahead and try stepwise conservative management.  Will send patient to physical therapy to work on cervical strengthening.  Will also go ahead and do Medrol  Dosepak at this time.  Discussed with patient that if the symptoms do not improve, patient will follow-up with us  and at that time can consider sending patient for steroid injection.  Patient is standing and agreeable with plan. Follow-Up Instructions: No follow-ups on file.   Orders:  Orders Placed This Encounter  Procedures   XR Cervical Spine 2 or 3 views   Ambulatory referral to Physical Therapy   Meds ordered this encounter  Medications   methylPREDNISolone  (MEDROL  DOSEPAK) 4 MG TBPK tablet    Sig: FOLLOW INSTRUCTIONS ON PACKAGE    Dispense:  1 each    Refill:  0      Procedures: No procedures performed   Clinical Data: No additional findings.   Subjective: Chief Complaint  Patient presents with   Neck - Pain    Patient is presenting with neck pain that radiates bilaterally into upper arms.  Per states that she has a history of neck pain and was told in the past that she had a pinched nerve in her neck.  Patient states that now she is having pain in her neck that radiates down into her upper shoulders.  Patient notes some burning sensation but denies any numbness or weakness of her upper extremities.  Patient is been doing ibuprofen  and  Tylenol  as needed for the pain and notes minimal relief.  Patient states that the pain has been significantly more bothersome over the past 2 months.  Patient works as a media planner and states that she is at a computer typing a lot and tries to work on her posture.    Review of Systems  Constitutional: Negative.   HENT: Negative.    Eyes: Negative.   Respiratory: Negative.    Cardiovascular: Negative.   Endocrine: Negative.   Musculoskeletal: Negative.   Neurological: Negative.   Hematological: Negative.   Psychiatric/Behavioral: Negative.    All other systems reviewed and are negative.    Objective: Vital Signs: There were no vitals taken for this visit.  Physical Exam Vitals and nursing note reviewed.  Constitutional:      Appearance: She is well-developed.  HENT:     Head: Atraumatic.     Nose: Nose normal.  Eyes:     Extraocular Movements: Extraocular movements intact.  Cardiovascular:     Pulses: Normal pulses.  Pulmonary:     Effort: Pulmonary effort is normal.  Abdominal:     Palpations: Abdomen is soft.  Musculoskeletal:     Cervical back: Neck supple.  Skin:    General: Skin is warm.     Capillary  Refill: Capillary refill takes less than 2 seconds.  Neurological:     Mental Status: She is alert. Mental status is at baseline.  Psychiatric:        Behavior: Behavior normal.        Thought Content: Thought content normal.        Judgment: Judgment normal.    Ortho Exam  Cervical spine:   - Inspection: no gross deformity or asymmetry, swelling or ecchymosis. No skin changes.  - Palpation: No TTP over the spinous processes, noted TTP over paraspinal muscles bilaterally  - ROM: full active ROM of the cervical spine in flex/ext/SB/rotation without pain    - Strength: 5/5 strength of upper extremity in C5-T1 nerve root distributions b/l  *C5: Shoulder abduction  *C6: Elbow flexion, Wrist extension  *C7: Elbow extension  *C8: Thumb extension, Wrist  Ulnar deviation  *T1: Finger abduction    - Neuro: sensation intact in the C5-T1 nerve root distribution b/l;  - Provocative Testing: Positive Spurling's Test   Specialty Comments:  No specialty comments available.  Imaging: XR Cervical Spine 2 or 3 views Result Date: 08/02/2023 2 view x-ray of the cervical spine shows disc height narrowing between C6/C7.  There is some anterior and posterior spurring located at the same location.  There are some noted facet arthropathy throughout the cervical spine between C3 to C8    PMFS History: Patient Active Problem List   Diagnosis Date Noted   Near syncope 09/04/2021   Chest pain of uncertain etiology 09/04/2021   Palpitations 09/04/2021   Appendicitis 09/06/2018   Past Medical History:  Diagnosis Date   Anxiety    Appendicitis    Chest pain    Dental infection    Flank pain    Headache    Hearing loss    Hypokalemia    Lumbar pain    Mass of right breast    Medical history non-contributory    Muscle strain    Near syncope    Pharyngitis    Pulsatile tinnitus of right ear    Sensorineural hearing loss    Urinary frequency    UTI (urinary tract infection)    Weakness     Family History  Problem Relation Age of Onset   Colon cancer Father     Past Surgical History:  Procedure Laterality Date   APPENDECTOMY  09/06/2018   CARPAL TUNNEL RELEASE Left    CARPAL TUNNEL RELEASE Right    LAPAROSCOPIC APPENDECTOMY N/A 09/06/2018   Procedure: APPENDECTOMY LAPAROSCOPIC;  Surgeon: Rubin Calamity, MD;  Location: [facility] OR;  Service: General;  Laterality: N/A;   TUBAL LIGATION     Social History   Occupational History   Not on file  Tobacco Use   Smoking status: Never    Passive exposure: Past   Smokeless tobacco: Never  Vaping Use   Vaping status: Never Used  Substance and Sexual Activity   Alcohol use: Yes    Comment: occ.   Drug use: No   Sexual activity: Yes    Birth control/protection: Surgical

## 2023-08-02 ENCOUNTER — Other Ambulatory Visit (INDEPENDENT_AMBULATORY_CARE_PROVIDER_SITE_OTHER): Payer: Self-pay

## 2023-08-02 ENCOUNTER — Ambulatory Visit (INDEPENDENT_AMBULATORY_CARE_PROVIDER_SITE_OTHER): Payer: Self-pay | Admitting: Orthopaedic Surgery

## 2023-08-02 ENCOUNTER — Encounter: Payer: Self-pay | Admitting: Orthopaedic Surgery

## 2023-08-02 DIAGNOSIS — M5412 Radiculopathy, cervical region: Secondary | ICD-10-CM

## 2023-08-02 DIAGNOSIS — M25512 Pain in left shoulder: Secondary | ICD-10-CM

## 2023-08-02 DIAGNOSIS — M542 Cervicalgia: Secondary | ICD-10-CM

## 2023-08-02 DIAGNOSIS — G8929 Other chronic pain: Secondary | ICD-10-CM

## 2023-08-02 DIAGNOSIS — M25511 Pain in right shoulder: Secondary | ICD-10-CM

## 2023-08-02 MED ORDER — METHYLPREDNISOLONE 4 MG PO TBPK: ORAL_TABLET | ORAL | 0 refills | Status: DC

## 2023-08-24 ENCOUNTER — Ambulatory Visit: Payer: Self-pay | Admitting: Physical Therapy

## 2023-09-09 ENCOUNTER — Ambulatory Visit: Payer: Self-pay | Admitting: Physical Therapy

## 2023-10-26 ENCOUNTER — Ambulatory Visit: Payer: Self-pay | Admitting: Surgery

## 2023-10-31 ENCOUNTER — Encounter (INDEPENDENT_AMBULATORY_CARE_PROVIDER_SITE_OTHER): Payer: Self-pay

## 2023-11-18 ENCOUNTER — Ambulatory Visit: Payer: Self-pay | Admitting: Family Medicine

## 2023-11-18 NOTE — Progress Notes (Deleted)
 New Patient Office Visit  Subjective    Patient ID: Kelly Haley, female    DOB: June 04, 1981  Age: 43 y.o. MRN: [id number]  CC: No chief complaint on file.   HPI [name] presents to establish care today. Up to date on routine vaccines. Up to date on routine screenings.  Receives regular dental and eye care.  Reports eating well, sleeping well, feeling well overall.  Reports compliance with medication regimen.  Denies other concerns today.  Outpatient Encounter Medications as of 11/18/2023  Medication Sig  . escitalopram (LEXAPRO) 10 MG tablet Take 10 mg by mouth at bedtime.  . famotidine (PEPCID) 40 MG tablet Take 40 mg by mouth daily.  . fluticasone  (FLONASE ) 50 MCG/ACT nasal spray Place 2 sprays into both nostrils daily.  . methylPREDNISolone  (MEDROL  DOSEPAK) 4 MG TBPK tablet FOLLOW INSTRUCTIONS ON PACKAGE  . ondansetron  (ZOFRAN -ODT) 8 MG disintegrating tablet Take 1 tablet (8 mg total) by mouth every 8 (eight) hours as needed for nausea or vomiting.  . rizatriptan  (MAXALT -MLT) 10 MG disintegrating tablet Take 1 tablet (10 mg total) by mouth as needed for migraine. May repeat in 2 hours if needed   No facility-administered encounter medications on file as of 11/18/2023.    Past Medical History:  Diagnosis Date  . Anxiety   . Appendicitis   . Chest pain   . Dental infection   . Flank pain   . Headache   . Hearing loss   . Hypokalemia   . Lumbar pain   . Mass of right breast   . Medical history non-contributory   . Muscle strain   . Near syncope   . Pharyngitis   . Pulsatile tinnitus of right ear   . Sensorineural hearing loss   . Urinary frequency   . UTI (urinary tract infection)   . Weakness     Past Surgical History:  Procedure Laterality Date  . APPENDECTOMY  09/06/2018  . CARPAL TUNNEL RELEASE Left   . CARPAL TUNNEL RELEASE Right   . LAPAROSCOPIC APPENDECTOMY N/A 09/06/2018   Procedure: APPENDECTOMY LAPAROSCOPIC;  Surgeon: Shela Derby, MD;  Location: [facility] OR;  Service: General;  Laterality: N/A;  . TUBAL LIGATION      Family History  Problem Relation Age of Onset  . Colon cancer Father     Social History   Socioeconomic History  . Marital status: Legally Separated    Spouse name: Not on file  . Number of children: Not on file  . Years of education: Not on file  . Highest education level: Not on file  Occupational History  . Not on file  Tobacco Use  . Smoking status: Never    Passive exposure: Past  . Smokeless tobacco: Never  Vaping Use  . Vaping status: Never Used  Substance and Sexual Activity  . Alcohol use: Yes    Comment: occ.  . Drug use: No  . Sexual activity: Yes    Birth control/protection: Surgical  Other Topics Concern  . Not on file  Social History Narrative  . Not on file   Social Drivers of Health   Financial Resource Strain: Not on file  Food Insecurity: No Food Insecurity (06/04/2021)   Hunger Vital Sign   . Worried About Programme researcher, broadcasting/film/video in the Last Year: Never true   . Ran Out of Food in the Last Year: Never true  Transportation Needs: No Transportation Needs (06/04/2021)   PRAPARE - Transportation   .  Lack of Transportation (Medical): No   . Lack of Transportation (Non-Medical): No  Physical Activity: Not on file  Stress: Not on file  Social Connections: Not on file  Intimate Partner Violence: Not on file    ROS Per HPI      Objective    There were no vitals taken for this visit.  Physical Exam Vitals and nursing note reviewed.  Constitutional:      General: She is not in acute distress.    Appearance: Normal appearance. She is normal weight.  HENT:     Head: Normocephalic and atraumatic.     Right Ear: External ear normal.     Left Ear: External ear normal.     Nose: Nose normal.     Mouth/Throat:     Mouth: Mucous membranes are moist.     Pharynx: Oropharynx is clear.  Eyes:     Extraocular Movements: Extraocular movements intact.      Pupils: Pupils are equal, round, and reactive to light.  Cardiovascular:     Rate and Rhythm: Normal rate and regular rhythm.     Pulses: Normal pulses.     Heart sounds: Normal heart sounds.  Pulmonary:     Effort: Pulmonary effort is normal. No respiratory distress.     Breath sounds: Normal breath sounds. No wheezing, rhonchi or rales.  Musculoskeletal:        General: Normal range of motion.     Cervical back: Normal range of motion.     Right lower leg: No edema.     Left lower leg: No edema.  Lymphadenopathy:     Cervical: No cervical adenopathy.  Neurological:     General: No focal deficit present.     Mental Status: She is alert and oriented to person, place, and time.  Psychiatric:        Mood and Affect: Mood normal.        Thought Content: Thought content normal.       Assessment & Plan:   There are no diagnoses linked to this encounter.   No follow-ups on file.   Wellington Half, FNP

## 2023-11-22 ENCOUNTER — Emergency Department (HOSPITAL_COMMUNITY)
Admission: EM | Admit: 2023-11-22 | Discharge: 2023-11-23 | Discharge status: Against Medical Advice | Attending: Emergency Medicine | Admitting: Emergency Medicine

## 2023-11-22 ENCOUNTER — Other Ambulatory Visit: Payer: Self-pay

## 2023-11-22 ENCOUNTER — Encounter (HOSPITAL_COMMUNITY): Payer: Self-pay | Admitting: Emergency Medicine

## 2023-11-22 DIAGNOSIS — M545 Low back pain, unspecified: Secondary | ICD-10-CM | POA: Insufficient documentation

## 2023-11-22 DIAGNOSIS — Z5321 Procedure and treatment not carried out due to patient leaving prior to being seen by health care provider: Secondary | ICD-10-CM | POA: Insufficient documentation

## 2023-11-22 LAB — URINALYSIS, ROUTINE W REFLEX MICROSCOPIC
Bacteria, UA: NONE SEEN
Bilirubin Urine: NEGATIVE
Glucose, UA: NEGATIVE mg/dL
Hgb urine dipstick: NEGATIVE
Ketones, ur: NEGATIVE mg/dL
Nitrite: NEGATIVE
Protein, ur: NEGATIVE mg/dL
Specific Gravity, Urine: 1.023 (ref 1.005–1.030)
pH: 5 (ref 5.0–8.0)

## 2023-11-22 NOTE — ED Triage Notes (Signed)
 Pt POV c/o lower back pain x3-4 days, pain radiates down left leg. States pain makes it difficult for her to sleep or walk. Denies recent fall or injury. Denies urinary symptoms.

## 2023-11-23 ENCOUNTER — Other Ambulatory Visit: Payer: Self-pay

## 2023-11-23 ENCOUNTER — Ambulatory Visit (HOSPITAL_COMMUNITY)
Admission: EM | Admit: 2023-11-23 | Discharge: 2023-11-23 | Disposition: A | Discharge status: Home / Self Care | Attending: Family Medicine | Admitting: Family Medicine

## 2023-11-23 DIAGNOSIS — M5442 Lumbago with sciatica, left side: Secondary | ICD-10-CM

## 2023-11-23 DIAGNOSIS — M5441 Lumbago with sciatica, right side: Secondary | ICD-10-CM | POA: Diagnosis not present

## 2023-11-23 MED ORDER — METHYLPREDNISOLONE 4 MG PO TBPK: ORAL_TABLET | ORAL | 0 refills | Status: DC

## 2023-11-23 MED ORDER — METHOCARBAMOL 1000 MG PO TABS: 1000.0000 mg | ORAL_TABLET | Freq: Three times a day (TID) | ORAL | 0 refills | Status: AC | PRN

## 2023-11-23 MED ORDER — HYDROCODONE-ACETAMINOPHEN 5-325 MG PO TABS: 1.0000 | ORAL_TABLET | Freq: Four times a day (QID) | ORAL | 0 refills | Status: AC | PRN

## 2023-11-23 NOTE — Discharge Instructions (Signed)
 Be aware, you have been prescribed pain medications that may cause drowsiness. While taking this medication, do not take any other medications containing acetaminophen (Tylenol). Do not combine with alcohol or recreational drugs. Please do not drive, operate heavy machinery, or take part in activities that require making important decisions while on this medication as your judgement may be clouded.

## 2023-11-23 NOTE — ED Triage Notes (Signed)
 PT reports lower back pain that radiates down both legs. Pt started triage at [facility] last night but left do to wait time. Pt reports she gave a urine sample while in ED.

## 2023-11-23 NOTE — ED Triage Notes (Signed)
 PT DOB and full Name confirmed.

## 2023-11-24 NOTE — ED Provider Notes (Signed)
 [facility]   [id number] 11/23/23 Arrival Time: 1639  ASSESSMENT & PLAN:  1. Acute bilateral low back pain with bilateral sciatica    Able to ambulate here and hemodynamically stable. No indication for imaging of back at this time given no trauma and normal neurological exam.   Trial of: Meds ordered this encounter  Medications   methocarbamol  1000 MG TABS    Sig: Take 1,000 mg by mouth every 8 (eight) hours as needed for muscle spasms.    Dispense:  30 tablet    Refill:  0   HYDROcodone -acetaminophen  (NORCO/VICODIN) 5-325 MG tablet    Sig: Take 1 tablet by mouth every 6 (six) hours as needed for moderate pain (pain score 4-6) or severe pain (pain score 7-10).    Dispense:  8 tablet    Refill:  0   methylPREDNISolone  (MEDROL  DOSEPAK) 4 MG TBPK tablet    Sig: Take as directed.    Dispense:  1 each    Refill:  0   Work/school excuse note: provided. Medication sedation precautions given. Encourage ROM/movement as tolerated.  Recommend:  Follow-up Information     [facility].   Specialty: Emergency Medicine Why: If symptoms worsen in any way. Contact information: [address] [place]  [phone]                Reviewed expectations re: course of current medical issues. Questions answered. Outlined signs and symptoms indicating need for more acute intervention. Patient verbalized understanding. After Visit Summary given.   SUBJECTIVE: History from: patient.  Kelly Haley is a 43 y.o. female who presents with complaint of intermittent bilateral LBP that radiates to both legs; noted yesterday; same today; rough night trying to sleep. Denies extremity sensation changes or weakness. Pain is worse with movement. Normal bowel/bladder habits. OTC analgesics without relief. No h/o similar. Denies fever/abd pain.   OBJECTIVE:  Vitals:   11/23/23 1729  BP: 119/83  Pulse:  83  Resp: 20  Temp: 97.9 F (36.6 C)  SpO2: 97%    General appearance: alert; no distress HEENT: [place]; AT Neck: supple with FROM; without midline tenderness CV: regular Lungs: unlabored respirations; speaks full sentences without difficulty Abdomen: soft, non-tender; non-distended Back: poorly localized reported tenderness to palpation over lumbar region; FROM at waist; bruising: none; without midline tenderness Extremities: without edema; symmetrical without gross deformities; normal ROM of bilateral LE Skin: warm and dry Neurologic: normal gait; normal sensation and strength of bilateral LE Psychological: alert and cooperative; normal mood and affect  Labs Reviewed: Results for orders placed or performed during the hospital encounter of 11/22/23  Urinalysis, Routine w reflex microscopic -Urine, Clean Catch   Collection Time: 11/22/23 11:19 PM  Result Value Ref Range   Color, Urine YELLOW YELLOW   APPearance HAZY (A) CLEAR   Specific Gravity, Urine 1.023 1.005 - 1.030   pH 5.0 5.0 - 8.0   Glucose, UA NEGATIVE NEGATIVE mg/dL   Hgb urine dipstick NEGATIVE NEGATIVE   Bilirubin Urine NEGATIVE NEGATIVE   Ketones, ur NEGATIVE NEGATIVE mg/dL   Protein, ur NEGATIVE NEGATIVE mg/dL   Nitrite NEGATIVE NEGATIVE   Leukocytes,Ua SMALL (A) NEGATIVE   RBC / HPF 0-5 0 - 5 RBC/hpf   WBC, UA 0-5 0 - 5 WBC/hpf   Bacteria, UA NONE SEEN NONE SEEN   Squamous Epithelial / HPF 6-10 0 - 5 /HPF   Mucus PRESENT    Labs Reviewed - No  data to display  Imaging: No results found.  Allergies  Allergen Reactions   Penicillins Hives and Nausea And Vomiting    TOLERATED CEFAZOLIN . Has patient had a PCN reaction causing immediate rash, facial/tongue/throat swelling, SOB or lightheadedness with hypotension: Yes Has patient had a PCN reaction causing severe rash involving mucus membranes or skin necrosis: No Has patient had a PCN reaction that required hospitalization Yes Has patient had a PCN reaction  occurring within the last 10 years: No If all of the above answers are "NO", then may proceed with Cephalospori    Past Medical History:  Diagnosis Date   Anxiety    Appendicitis    Chest pain    Dental infection    Flank pain    Headache    Hearing loss    Hypokalemia    Lumbar pain    Mass of right breast    Medical history non-contributory    Muscle strain    Near syncope    Pharyngitis    Pulsatile tinnitus of right ear    Sensorineural hearing loss    Urinary frequency    UTI (urinary tract infection)    Weakness    Social History   Socioeconomic History   Marital status: Legally Separated    Spouse name: Not on file   Number of children: Not on file   Years of education: Not on file   Highest education level: Not on file  Occupational History   Not on file  Tobacco Use   Smoking status: Never    Passive exposure: Past   Smokeless tobacco: Never  Vaping Use   Vaping status: Never Used  Substance and Sexual Activity   Alcohol use: Yes    Comment: occ.   Drug use: No   Sexual activity: Yes    Birth control/protection: Surgical  Other Topics Concern   Not on file  Social History Narrative   Not on file   Social Drivers of Health   Financial Resource Strain: Not on file  Food Insecurity: No Food Insecurity (06/04/2021)   Hunger Vital Sign    Worried About Running Out of Food in the Last Year: Never true    Ran Out of Food in the Last Year: Never true  Transportation Needs: No Transportation Needs (06/04/2021)   PRAPARE - Administrator, Civil Service (Medical): No    Lack of Transportation (Non-Medical): No  Physical Activity: Not on file  Stress: Not on file  Social Connections: Not on file  Intimate Partner Violence: Not on file   Family History  Problem Relation Age of Onset   Colon cancer Father    Past Surgical History:  Procedure Laterality Date   APPENDECTOMY  09/06/2018   CARPAL TUNNEL RELEASE Left    CARPAL TUNNEL  RELEASE Right    LAPAROSCOPIC APPENDECTOMY N/A 09/06/2018   Procedure: APPENDECTOMY LAPAROSCOPIC;  Surgeon: Shela Derby, MD;  Location: [facility] OR;  Service: General;  Laterality: N/A;   TUBAL Jules Oar, MD 11/24/23 1342

## 2023-12-19 ENCOUNTER — Encounter (INDEPENDENT_AMBULATORY_CARE_PROVIDER_SITE_OTHER): Payer: Self-pay

## 2023-12-21 ENCOUNTER — Encounter: Payer: Self-pay | Admitting: Student

## 2023-12-28 ENCOUNTER — Ambulatory Visit (INDEPENDENT_AMBULATORY_CARE_PROVIDER_SITE_OTHER): Admitting: Nurse Practitioner

## 2023-12-28 ENCOUNTER — Encounter: Payer: Self-pay | Admitting: Nurse Practitioner

## 2023-12-28 ENCOUNTER — Telehealth: Payer: Self-pay

## 2023-12-28 VITALS — BP 122/88 | HR 78 | Ht 64.0 in | Wt 274.0 lb

## 2023-12-28 DIAGNOSIS — K5900 Constipation, unspecified: Secondary | ICD-10-CM

## 2023-12-28 DIAGNOSIS — K219 Gastro-esophageal reflux disease without esophagitis: Secondary | ICD-10-CM | POA: Diagnosis not present

## 2023-12-28 DIAGNOSIS — Z800 Family history of malignant neoplasm of digestive organs: Secondary | ICD-10-CM | POA: Diagnosis not present

## 2023-12-28 DIAGNOSIS — K625 Hemorrhage of anus and rectum: Secondary | ICD-10-CM

## 2023-12-28 DIAGNOSIS — K5909 Other constipation: Secondary | ICD-10-CM | POA: Diagnosis not present

## 2023-12-28 MED ORDER — PANTOPRAZOLE SODIUM 40 MG PO TBEC: 40.0000 mg | DELAYED_RELEASE_TABLET | Freq: Every day | ORAL | 1 refills | Status: DC

## 2023-12-28 MED ORDER — NA SULFATE-K SULFATE-MG SULF 17.5-3.13-1.6 GM/177ML PO SOLN: 1.0000 | Freq: Once | ORAL | 0 refills | Status: AC

## 2023-12-28 NOTE — Progress Notes (Signed)
 Noted

## 2023-12-28 NOTE — Telephone Encounter (Signed)
   [facility] [address] [place], [place]  [address] Phone:  [phone]   Fax:  [phone]     Kelly Haley November 13, 1980 [id number]  02/07/2024   Dear Dr. Gloriann Larger:  We have scheduled the above named patient for a(n) endoscopy and colonoscopy procedures. We are requesting cardiac clearance prior to her scheduled procedure date on 02/07/24.  Please advise as to whether the patient may with their procedure.  Please route your response to [facility], CMA or fax response to [phone].  Sincerely,    [facility]

## 2023-12-28 NOTE — Progress Notes (Addendum)
 12/28/2023 Kelly Haley [id number] 1980-11-20   CHIEF COMPLAINT: GERD, schedule a colonoscopy   HISTORY OF PRESENT ILLNESS: Kelly Haley is a 43 year old female with a past medical history of anxiety and GERD. Past appendectomy, tubal ligation and carpal tunnel surgery.  S/P cholecystectomy 02/2022 secondary to gallstones and s/p appendectomy 08/2018. She presents our office today as referred Kelly Aid NP to schedule a screening colonoscopy. She is also concerned regarding GERD symptoms with recurrent burning chest pain which is fairly constant and sometimes is sharp, no specific food triggers and seems similar to the discomfort she had prior to her cholecystectomy which was performed a few years ago. She denies having any chest pain with exertion or when walking up 2 flights of stairs. No SOB or DOE. She was seen by cardiologist Dr. Santo 09/04/2021 due to having atypical chest pain of unclear etiology, palpitations and near syncope. She underwent a 3-7-day monitor study which showed mostly a normal sinus rhythm, bradycardia with a heart rate of 47 and rare isolated PACs/PVCs. An ECHO was planned when she obtained a [place] card but was not done and she has not been seen by Dr. Santo since then. She describes having burning in her chest/esophagus which is sometimes tender to touch externally, sometimes feels like the chest area is bruised. She has increased belching and nausea without vomiting. She previously took Famotidine for GERD symptoms without improvement. She takes Meloxicam daily for the past few months for back pain. She feels full easily after a few bites. She has chronic constipation and passes a formed bowel movement once or twice weekly. She often strains when passing a BM. She occasionally sees a small amount of bright red blood on the stool in the toilet tissue. She tried MiraLAX  for 1 week which was ineffective. She used magnesium citrate  a few times without improvement. Her father was diagnosed with colon cancer at the age of 51 or 53. She drinks approximately three 16 ounce bottles of water daily. She endorsed having routine laboratory studies recently by her PCP which showed insulin resistance otherwise other labs were normal. She has gained 40 lbs over the past year.   Past Medical History:  Diagnosis Date   Anxiety    Appendicitis    Chest pain    Dental infection    Flank pain    Headache    Hearing loss    Hypokalemia    Lumbar pain    Mass of right breast    Medical history non-contributory    Muscle strain    Near syncope    Pharyngitis    Pulsatile tinnitus of right ear    Sensorineural hearing loss    Urinary frequency    UTI (urinary tract infection)    Weakness    Past Surgical History:  Procedure Laterality Date   APPENDECTOMY  09/06/2018   CARPAL TUNNEL RELEASE Left    CARPAL TUNNEL RELEASE Right    LAPAROSCOPIC APPENDECTOMY N/A 09/06/2018   Procedure: APPENDECTOMY LAPAROSCOPIC;  Surgeon: Rubin Calamity, MD;  Location: [facility] OR;  Service: General;  Laterality: N/A;   TUBAL LIGATION     Social History: She is separated. She has 2 sons and 1 daughter.  Past intermittent smoker, stopped smoking cigarettes 10+ years.  Infrequent alcohol use, less than 1-2 drinks per month.  No drug use.  Family History: Father with history of colon cancer diagnosed at 91 or 43.   Allergies  Allergen Reactions  Penicillins Hives and Nausea And Vomiting    TOLERATED CEFAZOLIN . Has patient had a PCN reaction causing immediate rash, facial/tongue/throat swelling, SOB or lightheadedness with hypotension: Yes Has patient had a PCN reaction causing severe rash involving mucus membranes or skin necrosis: No Has patient had a PCN reaction that required hospitalization Yes Has patient had a PCN reaction occurring within the last 10 years: No If all of the above answers are NO, then may proceed with [facility]      Outpatient Encounter Medications as of 12/28/2023  Medication Sig   buPROPion (WELLBUTRIN SR) 150 MG 12 hr tablet Take 150 mg by mouth 2 (two) times daily.   escitalopram (LEXAPRO) 10 MG tablet Take 10 mg by mouth at bedtime. (Patient not taking: Reported on 11/23/2023)   famotidine (PEPCID) 40 MG tablet Take 40 mg by mouth daily as needed for heartburn or indigestion.   fluticasone  (FLONASE ) 50 MCG/ACT nasal spray Place 2 sprays into both nostrils daily. (Patient not taking: Reported on 11/23/2023)   HYDROcodone -acetaminophen  (NORCO/VICODIN) 5-325 MG tablet Take 1 tablet by mouth every 6 (six) hours as needed for moderate pain (pain score 4-6) or severe pain (pain score 7-10).   ibuprofen  (ADVIL ) 200 MG tablet Take 400 mg by mouth every 6 (six) hours as needed for fever.   meloxicam (MOBIC) 15 MG tablet Take 15 mg by mouth at bedtime.   methocarbamol  1000 MG TABS Take 1,000 mg by mouth every 8 (eight) hours as needed for muscle spasms.   methylPREDNISolone  (MEDROL  DOSEPAK) 4 MG TBPK tablet Take as directed.   ondansetron  (ZOFRAN -ODT) 8 MG disintegrating tablet Take 1 tablet (8 mg total) by mouth every 8 (eight) hours as needed for nausea or vomiting. (Patient not taking: Reported on 11/23/2023)   rizatriptan  (MAXALT -MLT) 10 MG disintegrating tablet Take 1 tablet (10 mg total) by mouth as needed for migraine. May repeat in 2 hours if needed (Patient not taking: Reported on 11/23/2023)   No facility-administered encounter medications on file as of 12/28/2023.   REVIEW OF SYSTEMS:  Gen: Denies fever, sweats or chills. + Weight gain.  CV: Denies chest pain, palpitations or edema. Resp: Denies cough, shortness of breath of hemoptysis.  GI: See HPI. GU: Denies urinary burning, blood in urine, increased urinary frequency or incontinence. MS: + Back pain.  Derm: Denies rash, itchiness, skin lesions or unhealing ulcers. Psych: Denies depression, anxiety, memory loss or confusion. Heme: Denies bruising, easy  bleeding. Neuro:  Denies headaches, dizziness or paresthesias. Endo:  Denies any problems with DM, thyroid or adrenal function.  PHYSICAL EXAM: BP 122/88   Pulse 78   Ht 5' 4 (1.626 m)   Wt 274 lb (124.3 kg)   BMI 47.03 kg/m ' Wt Readings from Last 3 Encounters:  12/28/23 274 lb (124.3 kg)  02/20/23 230 lb (104.3 kg)  10/26/22 230 lb (104.3 kg)    General: 43 year old female in no acute distress. Head: Normocephalic and atraumatic. Eyes:  Sclerae non-icteric, conjunctive pink. Ears: Normal auditory acuity. Mouth: Dentition intact. No ulcers or lesions.  Neck: Supple, no lymphadenopathy or thyromegaly.  Lungs: Clear bilaterally to auscultation without wheezes, crackles or rhonchi. Heart: Regular rate and rhythm. No murmur, rub or gallop appreciated.  Abdomen: Soft, nondistended. Mild epigastric tenderness. No masses. No hepatosplenomegaly. Normoactive bowel sounds x 4 quadrants.  Rectal: Deferred.  Musculoskeletal: Symmetrical with no gross deformities. Skin: Warm and dry. No rash or lesions on visible extremities. Extremities: No edema. Neurological: Alert oriented x 4, no  focal deficits.  Psychological: Alert and cooperative. Normal mood and affect.  ASSESSMENT AND PLAN:  43 year old female with acid reflux symptoms, burning esophageal/chest pain which is fairly constant and unrelated to activity and no specific food triggers which was present prior to her cholecystectomy 02/2022.  Increased burping.  Early satiety.  On Meloxicam for the past 2 months.  Never had an EGD. -EGD benefits and risks discussed including risk with sedation, risk of bleeding, perforation and infection. Cardiac clearance required prior to proceeding with an EGD. -GERD diet -Pantoprazole  40 mg daily -Gaviscon 1 tablespoon p.o. 3 times daily as needed -Reduce Meloxicam use -Request copy of recent CBC, CMP and TSH from PCPs office  Colon cancer screening.  Father diagnosed with colon cancer in his mid  48s. -Colonoscopy benefits and risks discussed including risk with sedation, risk of bleeding, perforation and infection  -Cardiac clearance required prior to proceeding with an EGD as noted above.  Chronic constipation, passes a BM once or twice weekly. -MiraLAX  nightly -Dulcolax 1-2 tabs every third night -If no improvement will consider trial of Linzess -Increase water intake to 64 ounces daily -Dietary fiber as tolerated  Occasional rectal bleeding in setting of constipation and straining - Treat constipation as noted above - Colonoscopy as ordered above  ADDENDUM: Cardiac clearance received per [facility] cardiology NP 01/13/2024 as follows: Preoperative cardiovascular exam According to the Revised Cardiac Risk Index (RCRI), her Perioperative Risk of Major Cardiac Event is (%): 0.4. Her Functional Capacity in METs is: 9.89 according to the Duke Activity Status Index (DASI). Therefore, based on ACC/AHA guidelines, patient would be at acceptable risk for the planned procedure without further cardiovascular testing.            CC:  [place], Jacqueline, NP

## 2023-12-28 NOTE — Patient Instructions (Addendum)
 You have been scheduled for an endoscopy and colonoscopy. Please follow the written instructions given to you at your visit today.  If you use inhalers (even only as needed), please bring them with you on the day of your procedure.  DO NOT TAKE 7 DAYS PRIOR TO TEST- Trulicity (dulaglutide) Ozempic, Wegovy (semaglutide) Mounjaro (tirzepatide) Bydureon Bcise (exanatide extended release)  DO NOT TAKE 1 DAY PRIOR TO YOUR TEST Rybelsus (semaglutide) Adlyxin (lixisenatide) Victoza (liraglutide) Byetta (exanatide) ___________________________________________________________________________   Please purchase the following medications over the counter and take as directed: Miralax , take 1 capful at night  Dulcolax 1-2 tabs every 3rd night as needed.  Gaviscon Liquid 1 tablespoon three times a day as needed.  We have sent the following medications to your pharmacy for you to pick up at your convenience: Pantoprazole  40 mg daily taken 30 minutes before breakfast   Thank you for trusting me with your gastrointestinal care!   Everett Hitt, CRNP    _______________________________________________________  If your blood pressure at your visit was 140/90 or greater, please contact your primary care physician to follow up on this.  _______________________________________________________  If you are age 48 or older, your body mass index should be between 23-30. Your Body mass index is 47.03 kg/m. If this is out of the aforementioned range listed, please consider follow up with your Primary Care Provider.  If you are age 35 or younger, your body mass index should be between 19-25. Your Body mass index is 47.03 kg/m. If this is out of the aformentioned range listed, please consider follow up with your Primary Care Provider.   ________________________________________________________  The [facility] providers would like to encourage you to use MYCHART to communicate with providers  for non-urgent requests or questions.  Due to long hold times on the telephone, sending your provider a message by [facility] may be a faster and more efficient way to get a response.  Please allow 48 business hours for a response.  Please remember that this is for non-urgent requests.  _______________________________________________________

## 2023-12-30 ENCOUNTER — Telehealth: Payer: Self-pay | Admitting: *Deleted

## 2023-12-30 NOTE — Telephone Encounter (Signed)
   Pre-operative Risk Assessment    Patient Name: Kelly Haley  DOB: [birth year] MRN: [id number]   Date of last office visit: 09/04/21 DR. [facility] Date of next office visit: 01/13/24 MADISON FOUNTAIN, NP   Request for Surgical Clearance    Procedure:  ENDOSCOPY AND COLONOSCOPY  Date of Surgery:  Clearance 02/07/24                                Surgeon:  DR. Legrand Puma Surgeon's Group or Practice Name:  [facility]  [facility] Phone number:  [phone] Fax number:  [phone]   Type of Clearance Requested:   - Medical ; NONE INDICATED TO BE HELD   Type of Anesthesia:  Not Indicated (PROPOFOL ?)   Additional requests/questions:    Princ[place] Broom   12/30/2023, 4:03 PM

## 2023-12-30 NOTE — Telephone Encounter (Signed)
 Pt is scheduled for IN OFFICE Preop 01/13/24 with Palmer Bobo, NP  Will update surgeons office

## 2024-01-11 ENCOUNTER — Encounter (HOSPITAL_BASED_OUTPATIENT_CLINIC_OR_DEPARTMENT_OTHER): Payer: Self-pay | Admitting: *Deleted

## 2024-01-13 ENCOUNTER — Ambulatory Visit: Attending: Emergency Medicine | Admitting: Emergency Medicine

## 2024-01-13 ENCOUNTER — Encounter: Payer: Self-pay | Admitting: Emergency Medicine

## 2024-01-13 VITALS — BP 118/86 | HR 85 | Ht 63.0 in | Wt 274.8 lb

## 2024-01-13 DIAGNOSIS — Z01818 Encounter for other preprocedural examination: Secondary | ICD-10-CM | POA: Diagnosis not present

## 2024-01-13 DIAGNOSIS — R079 Chest pain, unspecified: Secondary | ICD-10-CM

## 2024-01-13 DIAGNOSIS — R002 Palpitations: Secondary | ICD-10-CM | POA: Diagnosis not present

## 2024-01-13 NOTE — Patient Instructions (Signed)
 Medication Instructions:   Your physician recommends that you continue on your current medications as directed. Please refer to the Current Medication list given to you today.  *If you need a refill on your cardiac medications before your next appointment, please call your pharmacy*    Follow-Up: At [facility], you and your health needs are our priority.  As part of our continuing mission to provide you with exceptional heart care, our providers are all part of one team.  This team includes your primary Cardiologist (physician) and Advanced Practice Providers or APPs (Physician Assistants and Nurse Practitioners) who all work together to provide you with the care you need, when you need it.  Your next appointment:   1 year(s)  Provider:   Stanly DELENA Leavens, MD or Lum Louis NP

## 2024-01-13 NOTE — Progress Notes (Signed)
 Cardiology Office Note:    Date:  01/13/2024  ID:  Kelly Haley, DOB [birth year], MRN [id number] PCP: Desiree Quale, NP  [facility] Providers Cardiologist:  Stanly DELENA Leavens, MD       Patient Profile:       Chief Complaint: Preoperative cardiovascular exam History of Present Illness:  Kelly Haley is a 43 y.o. female with visit-pertinent history of obesity, heart murmur  She established with cardiology service on 09/04/2021 for near syncope, palpitations, and chest pain.  Notes that she has spells with intermittent heart racing associated dizziness.  She underwent ZIO 09/04/2021 showing average heart rate 80 bpm, underlying rhythm was sinus rhythm, rare PAC/PVC.  She has now pending endoscopy and colonoscopy with [facility] on 02/07/2024.   Discussed the use of AI scribe software for clinical note transcription with the patient, who gave verbal consent to proceed.  History of Present Illness Kelly Haley is a 43 year old female who presents for preoperative cardiac clearance  Today patient is doing well overall.  She is without acute cardiovascular concerns or complaints.  She is without any anginal symptoms.  Denies any chest pains.  She experienced palpitations, chest pain, and near syncope approximately two years ago and underwent evaluation with a heart monitor, which showed an average heart rate of 80 bpm without fast heart rhythms. Currently, she experiences occasional palpitations, described as feeling like her heart 'skips a beat', occurring once every couple of weeks, often when lying down or trying to sleep. These episodes are brief and not alarming. She denies current dizziness, lightheadedness, or chest pain.  She denies any further syncope or presyncope.  She is without any dyspnea, orthopnea, PND.  Review of systems:  Please see the history of present illness. All other systems are reviewed and otherwise negative.      Studies  Reviewed:    EKG Interpretation Date/Time:  Friday January 13 2024 15:14:17 EDT Ventricular Rate:  105 PR Interval:  130 QRS Duration:  88 QT Interval:  320 QTC Calculation: 422 R Axis:   53  Text Interpretation: Sinus tachycardia Nonspecific T wave abnormality When compared with ECG of 20-Feb-2023 15:54, PREVIOUS ECG IS PRESENT Confirmed by Rana Dixon [phone]) on 01/13/2024 3:50:42 PM    ZIO 09/04/2021 Patient had a minimum heart rate of 47 bpm, maximum heart rate of 160 bpm, and average heart rate of 80 bpm. Predominant underlying rhythm was sinus rhythm. Isolated PACs were rare (<1.0%). Isolated PVCs were rare (<1.0%). Triggered and diary events associated with sinus rhythm, sinus tachycardia, rarely PACs and PVCs.  Risk Assessment/Calculations:              Physical Exam:   VS:  BP 118/86   Pulse 85   Ht 5' 3 (1.6 m)   Wt 274 lb 12.8 oz (124.6 kg)   SpO2 98%   BMI 48.68 kg/m    Wt Readings from Last 3 Encounters:  01/13/24 274 lb 12.8 oz (124.6 kg)  12/28/23 274 lb (124.3 kg)  02/20/23 230 lb (104.3 kg)    GEN: Well nourished, well developed in no acute distress NECK: No JVD; No carotid bruits CARDIAC: RRR, no murmurs, rubs, gallops RESPIRATORY:  Clear to auscultation without rales, wheezing or rhonchi  ABDOMEN: Soft, non-tender, non-distended EXTREMITIES:  No edema; No acute deformity      Assessment and Plan:  Palpitations Chest pain ZIO 08/2021 with average heart 80 bpm with rare PAC/PVC EKG today was without ischemic changes  or arrhythmia HR initially in office was 105 bpm which improved to 85 bpm at rest - Today she is without any anginal symptoms, no indication for further ischemic evaluation at this time - She reports vast improvement in her palpitations since last seen in 2023.  Reports she felt this was caused by a new anxiety medication at the time.  She says she will occasionally get a brief palpitation described as a skipped beat once every  couple weeks.  She denies any syncope, presyncope, chest pains, or dyspnea - Well-controlled, no change to current therapy - Monitor on heart rate and rhythm at home on Apple Watch - Educated on potential triggers including caffeine , nicotine, dehydration, alcohol, lack of sleep, anxiety/stress  Preoperative cardiovascular exam According to the Revised Cardiac Risk Index (RCRI), her Perioperative Risk of Major Cardiac Event is (%): 0.4. Her Functional Capacity in METs is: 9.89 according to the Duke Activity Status Index (DASI). Therefore, based on ACC/AHA guidelines, patient would be at acceptable risk for the planned procedure without further cardiovascular testing. I will route this recommendation to the requesting party via Epic fax function.      Dispo:  No follow-ups on file.  Signed, Lum LITTIE Louis, NP

## 2024-01-19 NOTE — Telephone Encounter (Signed)
 Patient was seen for clearance on 01/13/24 by Lum Louis, NP.  Clearance states that patient would be at acceptable risk for the planned procedure without further cardiovascular testing.

## 2024-01-25 ENCOUNTER — Ambulatory Visit: Admitting: Orthopaedic Surgery

## 2024-02-03 ENCOUNTER — Encounter (HOSPITAL_COMMUNITY): Payer: Self-pay

## 2024-02-03 ENCOUNTER — Ambulatory Visit (HOSPITAL_COMMUNITY)
Admission: EM | Admit: 2024-02-03 | Discharge: 2024-02-03 | Disposition: A | Discharge status: Home / Self Care | Attending: Emergency Medicine | Admitting: Emergency Medicine

## 2024-02-03 DIAGNOSIS — G8929 Other chronic pain: Secondary | ICD-10-CM

## 2024-02-03 DIAGNOSIS — M542 Cervicalgia: Secondary | ICD-10-CM | POA: Diagnosis not present

## 2024-02-03 MED ORDER — BACLOFEN 10 MG PO TABS: 10.0000 mg | ORAL_TABLET | Freq: Three times a day (TID) | ORAL | 0 refills | Status: AC

## 2024-02-03 MED ORDER — LIDOCAINE 5 % EX PTCH: 1.0000 | MEDICATED_PATCH | CUTANEOUS | 0 refills | Status: AC

## 2024-02-03 MED ORDER — KETOROLAC TROMETHAMINE 30 MG/ML IJ SOLN
30.0000 mg | Freq: Once | INTRAMUSCULAR | Status: AC
  Administered 2024-02-03: 30 mg via INTRAMUSCULAR

## 2024-02-03 MED ORDER — KETOROLAC TROMETHAMINE 30 MG/ML IJ SOLN
INTRAMUSCULAR | Status: AC
  Filled 2024-02-03: qty 1

## 2024-02-03 MED ORDER — PREDNISONE 20 MG PO TABS: 40.0000 mg | ORAL_TABLET | Freq: Every day | ORAL | 0 refills | Status: AC

## 2024-02-03 NOTE — ED Provider Notes (Signed)
 [facility]    CSN: [id number] Arrival date & time: 02/03/24  8147      History   Chief Complaint Chief Complaint  Patient presents with   Neck Pain    HPI Kelly Haley is a 43 y.o. female.   Patient presents with chronic posterior neck pain x 1 year.  Patient states that the pain has become more severe over the last few months reports that it radiates to both of her shoulders and down her arms.  Patient states that she has taken steroids and pain medicine in the past with minimal relief.  Patient states that she has been taking methocarbamol  without relief.  Patient denies taking any other medication at this time.  Patient has seen orthopedic doctor for her chronic neck pain.  Patient is scheduled to see neurology in November for further evaluation of this as well.  Patient denies any recent falls or injuries to her neck.  Denies numbness, tingling, and weakness to her arms or legs.  The history is provided by the patient and medical records.  Neck Pain   Past Medical History:  Diagnosis Date   Anxiety    Appendicitis    Chest pain    Dental infection    Flank pain    Headache    Hearing loss    Hypokalemia    Lumbar pain    Mass of right breast    Medical history non-contributory    Muscle strain    Near syncope    Pharyngitis    Pulsatile tinnitus of right ear    Sensorineural hearing loss    Urinary frequency    UTI (urinary tract infection)    Weakness     Patient Active Problem List   Diagnosis Date Noted   Near syncope 09/04/2021   Chest pain of uncertain etiology 09/04/2021   Palpitations 09/04/2021   Appendicitis 09/06/2018    Past Surgical History:  Procedure Laterality Date   APPENDECTOMY  09/06/2018   CARPAL TUNNEL RELEASE Left    CARPAL TUNNEL RELEASE Right    GALLBLADDER SURGERY     LAPAROSCOPIC APPENDECTOMY N/A 09/06/2018   Procedure: APPENDECTOMY LAPAROSCOPIC;  Surgeon: Rubin Calamity, MD;  Location: [facility] OR;  Service:  General;  Laterality: N/A;   TUBAL LIGATION      OB History     Gravida  4   Para  3   Term  3   Preterm      AB  1   Living  3      SAB      IAB  1   Ectopic      Multiple      Live Births               Home Medications    Prior to Admission medications   Medication Sig Start Date End Date Taking? Authorizing Provider  baclofen (LIORESAL) 10 MG tablet Take 1 tablet (10 mg total) by mouth 3 (three) times daily. 02/03/24  Yes Shanterria Franta A, NP  lidocaine  (LIDODERM ) 5 % Place 1 patch onto the skin daily. Remove & Discard patch within 12 hours or as directed by MD 02/03/24  Yes Johnie Flaming A, NP  predniSONE  (DELTASONE ) 20 MG tablet Take 2 tablets (40 mg total) by mouth daily for 5 days. 02/03/24 02/08/24 Yes Johnie Flaming A, NP  buPROPion (WELLBUTRIN SR) 150 MG 12 hr tablet Take 150 mg by mouth 2 (two) times daily. 11/01/23   [provider]  escitalopram (LEXAPRO) 10 MG tablet Take 10 mg by mouth at bedtime. Patient not taking: Reported on 01/13/2024    [provider]  famotidine (PEPCID) 40 MG tablet Take 40 mg by mouth daily as needed for heartburn or indigestion.    [provider]  fluticasone  (FLONASE ) 50 MCG/ACT nasal spray Place 2 sprays into both nostrils daily. Patient not taking: Reported on 01/13/2024 10/26/22   Jerral Meth, MD  HYDROcodone -acetaminophen  (NORCO/VICODIN) 5-325 MG tablet Take 1 tablet by mouth every 6 (six) hours as needed for moderate pain (pain score 4-6) or severe pain (pain score 7-10). Patient not taking: Reported on 01/13/2024 11/23/23   Rolinda Rogue, MD  ibuprofen  (ADVIL ) 200 MG tablet Take 400 mg by mouth every 6 (six) hours as needed for fever. 05/22/19   [provider]  meloxicam (MOBIC) 15 MG tablet Take 15 mg by mouth at bedtime. Patient not taking: Reported on 01/13/2024 11/21/23   [provider]  methocarbamol  1000 MG TABS Take 1,000 mg by mouth every 8 (eight) hours as  needed for muscle spasms. 11/23/23   Rolinda Rogue, MD  ondansetron  (ZOFRAN -ODT) 8 MG disintegrating tablet Take 1 tablet (8 mg total) by mouth every 8 (eight) hours as needed for nausea or vomiting. Patient not taking: Reported on 01/13/2024 09/05/22   Dreama, [place]  N, FNP  pantoprazole  (PROTONIX ) 40 MG tablet Take 1 tablet (40 mg total) by mouth daily. 12/28/23   Kennedy-Smith, Colleen M, NP  rizatriptan  (MAXALT -MLT) 10 MG disintegrating tablet Take 1 tablet (10 mg total) by mouth as needed for migraine. May repeat in 2 hours if needed Patient not taking: Reported on 01/13/2024 02/20/23   Dean Clarity, MD    Family History Family History  Problem Relation Age of Onset   Colon cancer Father    Liver disease Neg Hx    Esophageal cancer Neg Hx     Social History Social History   Tobacco Use   Smoking status: Never    Passive exposure: Past   Smokeless tobacco: Never  Vaping Use   Vaping status: Never Used  Substance Use Topics   Alcohol use: Yes    Comment: occ.   Drug use: No     Allergies   Penicillins   Review of Systems Review of Systems  Musculoskeletal:  Positive for neck pain.   Per HPI  Physical Exam Triage Vital Signs ED Triage Vitals  Encounter Vitals Group     BP 02/03/24 1918 118/72     Girls Systolic BP Percentile --      Girls Diastolic BP Percentile --      Boys Systolic BP Percentile --      Boys Diastolic BP Percentile --      Pulse Rate 02/03/24 1918 97     Resp 02/03/24 1918 16     Temp 02/03/24 1918 97.9 F (36.6 C)     Temp Source 02/03/24 1918 Oral     SpO2 02/03/24 1918 96 %     Weight --      Height --      Head Circumference --      Peak Flow --      Pain Score 02/03/24 1917 8     Pain Loc --      Pain Education --      Exclude from Growth Chart --    No data found.  Updated Vital Signs BP 118/72 (BP Location: Left Arm)   Pulse 97  Temp 97.9 F (36.6 C) (Oral)   Resp 16   LMP 01/04/2024   SpO2 96%   Visual  Acuity Right Eye Distance:   Left Eye Distance:   Bilateral Distance:    Right Eye Near:   Left Eye Near:    Bilateral Near:     Physical Exam Vitals and nursing note reviewed.  Constitutional:      General: She is awake. She is not in acute distress.    Appearance: Normal appearance. She is well-developed and well-groomed. She is not ill-appearing.  Neck:     Comments: Tenderness noted to bilateral cervical paraspinal musculature Musculoskeletal:     Cervical back: Neck supple. No rigidity. Pain with movement and muscular tenderness present. No spinous process tenderness. Normal range of motion.  Neurological:     Mental Status: She is alert.  Psychiatric:        Behavior: Behavior is cooperative.      UC Treatments / Results  Labs (all labs ordered are listed, but only abnormal results are displayed) Labs Reviewed - No data to display  EKG   Radiology No results found.  Procedures Procedures (including critical care time)  Medications Ordered in UC Medications  ketorolac  (TORADOL ) 30 MG/ML injection 30 mg (30 mg Intramuscular Given 02/03/24 2001)    Initial Impression / Assessment and Plan / UC Course  I have reviewed the triage vital signs and the nursing notes.  Pertinent labs & imaging results that were available during my care of the patient were reviewed by me and considered in my medical decision making (see chart for details).     Patient is overall well-appearing.  Vitals are stable.  Given IM Toradol  in clinic for acute pain.  Prescribed prednisone  burst for additional pain relief.  Prescribed baclofen as needed for muscle pain and spasms.  Prescribed lidocaine  patches for additional pain relief as well.  Discussed follow-up and return precautions. Final Clinical Impressions(s) / UC Diagnoses   Final diagnoses:  Chronic neck pain     Discharge Instructions      You received an injection of Toradol  in clinic today to help with your pain.  Do not  take any NSAIDs including ibuprofen , Advil , Motrin , Aleve, or naproxen for at least 8 hours after receiving this injection.   Tomorrow start taking 2 tablets of prednisone  once daily for 5 days for additional pain relief.   You can also take baclofen every 8 hours as needed for muscle pain and spasms.  This can make you drowsy so do not drive, work, or drink alcohol while taking this. Do not take this in combination with methocarbamol . Otherwise follow-up with orthopedic doctor and/or primary care provider for further evaluation and management of your chronic pain.     ED Prescriptions     Medication Sig Dispense Auth. Provider   baclofen (LIORESAL) 10 MG tablet Take 1 tablet (10 mg total) by mouth 3 (three) times daily. 30 each Johnie Flaming A, NP   predniSONE  (DELTASONE ) 20 MG tablet Take 2 tablets (40 mg total) by mouth daily for 5 days. 10 tablet Johnie Flaming A, NP   lidocaine  (LIDODERM ) 5 % Place 1 patch onto the skin daily. Remove & Discard patch within 12 hours or as directed by MD 30 patch Johnie Flaming A, NP      I have reviewed the PDMP during this encounter.   Johnie Flaming A, NP 02/03/24 2020

## 2024-02-03 NOTE — Telephone Encounter (Signed)
 Pt is scheduled for an endoscopy and colonoscopy on 02/07/24 and still has not received cardiac clearance.  Please see previous note.

## 2024-02-03 NOTE — Discharge Instructions (Signed)
 You received an injection of Toradol  in clinic today to help with your pain.  Do not take any NSAIDs including ibuprofen , Advil , Motrin , Aleve, or naproxen for at least 8 hours after receiving this injection.   Tomorrow start taking 2 tablets of prednisone  once daily for 5 days for additional pain relief.   You can also take baclofen every 8 hours as needed for muscle pain and spasms.  This can make you drowsy so do not drive, work, or drink alcohol while taking this. Do not take this in combination with methocarbamol . Otherwise follow-up with orthopedic doctor and/or primary care provider for further evaluation and management of your chronic pain.

## 2024-02-03 NOTE — ED Triage Notes (Signed)
 Patient reports that she has had posterior neck pain x 1 year, but in the past month she has severe pain that radiates into both shoulders. Patient states she has taken steroids and has had pain meds. Patient reports that she is unable to sleep due to the pain.  Patient states she ha a neurology appointment,but it is now until November.

## 2024-02-07 ENCOUNTER — Ambulatory Visit (AMBULATORY_SURGERY_CENTER): Admitting: Internal Medicine

## 2024-02-07 ENCOUNTER — Encounter: Admitting: Internal Medicine

## 2024-02-07 ENCOUNTER — Encounter: Payer: Self-pay | Admitting: Internal Medicine

## 2024-02-07 VITALS — BP 141/87 | HR 87 | Temp 97.7°F | Resp 19 | Ht 64.0 in | Wt 274.0 lb

## 2024-02-07 DIAGNOSIS — K2100 Gastro-esophageal reflux disease with esophagitis, without bleeding: Secondary | ICD-10-CM

## 2024-02-07 DIAGNOSIS — D123 Benign neoplasm of transverse colon: Secondary | ICD-10-CM | POA: Diagnosis not present

## 2024-02-07 DIAGNOSIS — K317 Polyp of stomach and duodenum: Secondary | ICD-10-CM

## 2024-02-07 DIAGNOSIS — K625 Hemorrhage of anus and rectum: Secondary | ICD-10-CM

## 2024-02-07 DIAGNOSIS — Z1211 Encounter for screening for malignant neoplasm of colon: Secondary | ICD-10-CM | POA: Diagnosis present

## 2024-02-07 DIAGNOSIS — K635 Polyp of colon: Secondary | ICD-10-CM

## 2024-02-07 DIAGNOSIS — K449 Diaphragmatic hernia without obstruction or gangrene: Secondary | ICD-10-CM

## 2024-02-07 DIAGNOSIS — D124 Benign neoplasm of descending colon: Secondary | ICD-10-CM

## 2024-02-07 DIAGNOSIS — Z800 Family history of malignant neoplasm of digestive organs: Secondary | ICD-10-CM | POA: Diagnosis not present

## 2024-02-07 DIAGNOSIS — K5900 Constipation, unspecified: Secondary | ICD-10-CM

## 2024-02-07 DIAGNOSIS — K219 Gastro-esophageal reflux disease without esophagitis: Secondary | ICD-10-CM

## 2024-02-07 DIAGNOSIS — D122 Benign neoplasm of ascending colon: Secondary | ICD-10-CM | POA: Diagnosis not present

## 2024-02-07 MED ORDER — SODIUM CHLORIDE 0.9 % IV SOLN: 500.0000 mL | Freq: Once | INTRAVENOUS | Status: DC

## 2024-02-07 MED ORDER — PANTOPRAZOLE SODIUM 40 MG PO TBEC
40.0000 mg | DELAYED_RELEASE_TABLET | Freq: Two times a day (BID) | ORAL | 11 refills | Status: AC
Start: 2024-02-07 — End: ?

## 2024-02-07 NOTE — Progress Notes (Signed)
 Called to room to assist during endoscopic procedure.  Patient ID and intended procedure confirmed with present staff. Received instructions for my participation in the procedure from the performing physician.

## 2024-02-07 NOTE — Patient Instructions (Signed)
 Discharge instructions given. Handouts on polyps and Hiatal Hernia. Prescription sent to pharmacy. Resume previous medications. YOU HAD AN ENDOSCOPIC PROCEDURE TODAY AT THE [facility]:   Refer to the procedure report that was given to you for any specific questions about what was found during the examination.  If the procedure report does not answer your questions, please call your gastroenterologist to clarify.  If you requested that your care partner not be given the details of your procedure findings, then the procedure report has been included in a sealed envelope for you to review at your convenience later.  YOU SHOULD EXPECT: Some feelings of bloating in the abdomen. Passage of more gas than usual.  Walking can help get rid of the air that was put into your GI tract during the procedure and reduce the bloating. If you had a lower endoscopy (such as a colonoscopy or flexible sigmoidoscopy) you may notice spotting of blood in your stool or on the toilet paper. If you underwent a bowel prep for your procedure, you may not have a normal bowel movement for a few days.  Please Note:  You might notice some irritation and congestion in your nose or some drainage.  This is from the oxygen used during your procedure.  There is no need for concern and it should clear up in a day or so.  SYMPTOMS TO REPORT IMMEDIATELY:  Following lower endoscopy (colonoscopy or flexible sigmoidoscopy):  Excessive amounts of blood in the stool  Significant tenderness or worsening of abdominal pains  Swelling of the abdomen that is new, acute  Fever of 100F or higher  Following upper endoscopy (EGD)  Vomiting of blood or coffee ground material  New chest pain or pain under the shoulder blades  Painful or persistently difficult swallowing  New shortness of breath  Fever of 100F or higher  Black, tarry-looking stools  For urgent or emergent issues, a gastroenterologist can be reached at any hour by  calling [phone]. Do not use MyChart messaging for urgent concerns.    DIET:  We do recommend a small meal at first, but then you may proceed to your regular diet.  Drink plenty of fluids but you should avoid alcoholic beverages for 24 hours.  ACTIVITY:  You should plan to take it easy for the rest of today and you should NOT DRIVE or use heavy machinery until tomorrow (because of the sedation medicines used during the test).    FOLLOW UP: Our staff will call the number listed on your records the next business day following your procedure.  We will call around 7:15- 8:00 am to check on you and address any questions or concerns that you may have regarding the information given to you following your procedure. If we do not reach you, we will leave a message.     If any biopsies were taken you will be contacted by phone or by letter within the next 1-3 weeks.  Please call us  at [phone] if you have not heard about the biopsies in 3 weeks.    SIGNATURES/CONFIDENTIALITY: You and/or your care partner have signed paperwork which will be entered into your electronic medical record.  These signatures attest to the fact that that the information above on your After Visit Summary has been reviewed and is understood.  Full responsibility of the confidentiality of this discharge information lies with you and/or your care-partner.

## 2024-02-07 NOTE — Progress Notes (Signed)
 Expand All Collapse All        12/28/2023 Kelly Haley [id number] Jun 17, 1981     CHIEF COMPLAINT: GERD, schedule a colonoscopy    HISTORY OF PRESENT ILLNESS: Kelly Haley is a 43 year old female with a past medical history of anxiety and GERD. Past appendectomy, tubal ligation and carpal tunnel surgery.  S/P cholecystectomy 02/2022 secondary to gallstones and s/p appendectomy 08/2018. She presents our office today as referred [name] NP to schedule a screening colonoscopy. She is also concerned regarding GERD symptoms with recurrent burning chest pain which is fairly constant and sometimes is sharp, no specific food triggers and seems similar to the discomfort she had prior to her cholecystectomy which was performed a few years ago. She denies having any chest pain with exertion or when walking up 2 flights of stairs. No SOB or DOE. She was seen by cardiologist Dr. Santo 09/04/2021 due to having atypical chest pain of unclear etiology, palpitations and near syncope. She underwent a 3-7-day monitor study which showed mostly a normal sinus rhythm, bradycardia with a heart rate of 47 and rare isolated PACs/PVCs. An ECHO was planned when she obtained a [facility] orange card but was not done and she has not been seen by Dr. Santo since then. She describes having burning in her chest/esophagus which is sometimes tender to touch externally, sometimes feels like the chest area is bruised. She has increased belching and nausea without vomiting. She previously took Famotidine for GERD symptoms without improvement. She takes Meloxicam daily for the past few months for back pain. She feels full easily after a few bites. She has chronic constipation and passes a formed bowel movement once or twice weekly. She often strains when passing a BM. She occasionally sees a small amount of bright red blood on the stool in the toilet tissue. She tried MiraLAX  for 1 week which was  ineffective. She used magnesium citrate a few times without improvement. Her father was diagnosed with colon cancer at the age of 65 or 7. She drinks approximately three 16 ounce bottles of water daily. She endorsed having routine laboratory studies recently by her PCP which showed insulin resistance otherwise other labs were normal. She has gained 40 lbs over the past year.        Past Medical History:  Diagnosis Date   Anxiety     Appendicitis     Chest pain     Dental infection     Flank pain     Headache     Hearing loss     Hypokalemia     Lumbar pain     Mass of right breast     Medical history non-contributory     Muscle strain     Near syncope     Pharyngitis     Pulsatile tinnitus of right ear     Sensorineural hearing loss     Urinary frequency     UTI (urinary tract infection)     Weakness               Past Surgical History:  Procedure Laterality Date   APPENDECTOMY   09/06/2018   CARPAL TUNNEL RELEASE Left     CARPAL TUNNEL RELEASE Right     LAPAROSCOPIC APPENDECTOMY N/A 09/06/2018    Procedure: APPENDECTOMY LAPAROSCOPIC;  Surgeon: Rubin Calamity, MD;  Location: [facility] OR;  Service: General;  Laterality: N/A;   TUBAL LIGATION  Social History: She is separated. She has 2 sons and 1 daughter.  Past intermittent smoker, stopped smoking cigarettes 10+ years.  Infrequent alcohol use, less than 1-2 drinks per month.  No drug use.   Family History: Father with history of colon cancer diagnosed at 104 or 26.    Allergies       Allergies  Allergen Reactions   Penicillins Hives and Nausea And Vomiting      TOLERATED CEFAZOLIN . Has patient had a PCN reaction causing immediate rash, facial/tongue/throat swelling, SOB or lightheadedness with hypotension: Yes Has patient had a PCN reaction causing severe rash involving mucus membranes or skin necrosis: No Has patient had a PCN reaction that required hospitalization Yes Has patient had a PCN reaction  occurring within the last 10 years: No If all of the above answers are NO, then may proceed with [facility]            Outpatient Encounter Medications as of 12/28/2023  Medication Sig   buPROPion (WELLBUTRIN SR) 150 MG 12 hr tablet Take 150 mg by mouth 2 (two) times daily.   escitalopram (LEXAPRO) 10 MG tablet Take 10 mg by mouth at bedtime. (Patient not taking: Reported on 11/23/2023)   famotidine (PEPCID) 40 MG tablet Take 40 mg by mouth daily as needed for heartburn or indigestion.   fluticasone  (FLONASE ) 50 MCG/ACT nasal spray Place 2 sprays into both nostrils daily. (Patient not taking: Reported on 11/23/2023)   HYDROcodone -acetaminophen  (NORCO/VICODIN) 5-325 MG tablet Take 1 tablet by mouth every 6 (six) hours as needed for moderate pain (pain score 4-6) or severe pain (pain score 7-10).   ibuprofen  (ADVIL ) 200 MG tablet Take 400 mg by mouth every 6 (six) hours as needed for fever.   meloxicam (MOBIC) 15 MG tablet Take 15 mg by mouth at bedtime.   methocarbamol  1000 MG TABS Take 1,000 mg by mouth every 8 (eight) hours as needed for muscle spasms.   methylPREDNISolone  (MEDROL  DOSEPAK) 4 MG TBPK tablet Take as directed.   ondansetron  (ZOFRAN -ODT) 8 MG disintegrating tablet Take 1 tablet (8 mg total) by mouth every 8 (eight) hours as needed for nausea or vomiting. (Patient not taking: Reported on 11/23/2023)   rizatriptan  (MAXALT -MLT) 10 MG disintegrating tablet Take 1 tablet (10 mg total) by mouth as needed for migraine. May repeat in 2 hours if needed (Patient not taking: Reported on 11/23/2023)      No facility-administered encounter medications on file as of 12/28/2023.      REVIEW OF SYSTEMS:  Gen: Denies fever, sweats or chills. + Weight gain.  CV: Denies chest pain, palpitations or edema. Resp: Denies cough, shortness of breath of hemoptysis.  GI: See HPI. GU: Denies urinary burning, blood in urine, increased urinary frequency or incontinence. MS: + Back pain.  Derm: Denies rash,  itchiness, skin lesions or unhealing ulcers. Psych: Denies depression, anxiety, memory loss or confusion. Heme: Denies bruising, easy bleeding. Neuro:  Denies headaches, dizziness or paresthesias. Endo:  Denies any problems with DM, thyroid or adrenal function.   PHYSICAL EXAM: BP 122/88   Pulse 78   Ht 5' 4 (1.626 m)   Wt 274 lb (124.3 kg)   BMI 47.03 kg/m '    Wt Readings from Last 3 Encounters:  12/28/23 274 lb (124.3 kg)  02/20/23 230 lb (104.3 kg)  10/26/22 230 lb (104.3 kg)    General: 43 year old female in no acute distress. Head: Normocephalic and atraumatic. Eyes:  Sclerae non-icteric, conjunctive pink. Ears: Normal  auditory acuity. Mouth: Dentition intact. No ulcers or lesions.  Neck: Supple, no lymphadenopathy or thyromegaly.  Lungs: Clear bilaterally to auscultation without wheezes, crackles or rhonchi. Heart: Regular rate and rhythm. No murmur, rub or gallop appreciated.  Abdomen: Soft, nondistended. Mild epigastric tenderness. No masses. No hepatosplenomegaly. Normoactive bowel sounds x 4 quadrants.  Rectal: Deferred.  Musculoskeletal: Symmetrical with no gross deformities. Skin: Warm and dry. No rash or lesions on visible extremities. Extremities: No edema. Neurological: Alert oriented x 4, no focal deficits.  Psychological: Alert and cooperative. Normal mood and affect.   ASSESSMENT AND PLAN:   43 year old female with acid reflux symptoms, burning esophageal/chest pain which is fairly constant and unrelated to activity and no specific food triggers which was present prior to her cholecystectomy 02/2022.  Increased burping.  Early satiety.  On Meloxicam for the past 2 months.  Never had an EGD. -EGD benefits and risks discussed including risk with sedation, risk of bleeding, perforation and infection. Cardiac clearance required prior to proceeding with an EGD. -GERD diet -Pantoprazole  40 mg daily -Gaviscon 1 tablespoon p.o. 3 times daily as needed -Reduce  Meloxicam use -Request copy of recent CBC, CMP and TSH from PCPs office   Colon cancer screening.  Father diagnosed with colon cancer in his mid 54s. -Colonoscopy benefits and risks discussed including risk with sedation, risk of bleeding, perforation and infection  -Cardiac clearance required prior to proceeding with an EGD as noted above.   Chronic constipation, passes a BM once or twice weekly. -MiraLAX  nightly -Dulcolax 1-2 tabs every third night -If no improvement will consider trial of Linzess -Increase water intake to 64 ounces daily -Dietary fiber as tolerated   Occasional rectal bleeding in setting of constipation and straining - Treat constipation as noted above - Colonoscopy as ordered above   ADDENDUM: Cardiac clearance received per [facility] cardiology NP 01/13/2024 as follows: Preoperative cardiovascular exam According to the Revised Cardiac Risk Index (RCRI), her Perioperative Risk of Major Cardiac Event is (%): 0.4. Her Functional Capacity in METs is: 9.89 according to the Duke Activity Status Index (DASI). Therefore, based on ACC/AHA guidelines, patient would be at acceptable risk for the planned procedure without further cardiovascular testing.                    CC:  Desiree Quale, NP          Recent H&P as above.  Now for colonoscopy and upper endoscopy

## 2024-02-07 NOTE — Progress Notes (Signed)
PT taken to PACU. Monitors in place. VSS. Report given to RN. 

## 2024-02-07 NOTE — Op Note (Signed)
 [facility] Patient Name: Kelly Haley Procedure Date: 02/07/2024 2:46 PM MRN: [id number] Endoscopist: Norleen SAILOR. Abran , MD, [id number] Age: 43 Referring MD:  Date of Birth: 06-Aug-1980 Gender: Female Account #: [id number] Procedure:                Upper GI endoscopy Indications:              Esophageal reflux, dyspepsia, atypical chest                            discomfort. Status post cholecystectomy. Symptoms                            persist despite PPI for 1 month Medicines:                Monitored Anesthesia Care Procedure:                Pre-Anesthesia Assessment:                           - Prior to the procedure, a History and Physical                            was performed, and patient medications and                            allergies were reviewed. The patient's tolerance of                            previous anesthesia was also reviewed. The risks                            and benefits of the procedure and the sedation                            options and risks were discussed with the patient.                            All questions were answered, and informed consent                            was obtained. Prior Anticoagulants: The patient has                            taken no anticoagulant or antiplatelet agents. ASA                            Grade Assessment: II - A patient with mild systemic                            disease. After reviewing the risks and benefits,                            the patient was deemed in satisfactory condition to  undergo the procedure.                           After obtaining informed consent, the endoscope was                            passed under direct vision. Throughout the                            procedure, the patient's blood pressure, pulse, and                            oxygen saturations were monitored continuously. The                            Endoscope was  introduced through the mouth, and                            advanced to the second part of duodenum. The upper                            GI endoscopy was accomplished without difficulty.                            The patient tolerated the procedure well. Scope In: Scope Out: Findings:                 The esophagus revealed distal esophagitis as                            manifested by small erosions, erythema, and edema.                           The stomach revealed small hiatal hernia. Several                            diminutive benign fundic gland polyps. Otherwise                            normal.                           The examined duodenum was normal.                           The cardia and gastric fundus were normal on                            retroflexion. Complications:            No immediate complications. Estimated Blood Loss:     Estimated blood loss: none. Impression:               1. GERD with esophagitis. Recommendation:           - Patient has a contact number available for  emergencies. The signs and symptoms of potential                            delayed complications were discussed with the                            patient. Return to normal activities tomorrow.                            Written discharge instructions were provided to the                            patient.                           - Resume previous diet.                           - Continue present medications.                           - REFLUX PRECAUTIONS with attention WEIGHT LOSS                           - PRESCRIBED pantoprazole  40 mg p.o. twice daily;                            #60; 11 refills. Since she has ongoing symptoms and                            evidence of inflammation despite taking this                            medicine once daily, we are increasing your dosage                            to twice daily.                           -  RESUME care with your primary provider Norleen SAILOR. Abran, MD 02/07/2024 3:40:43 PM This report has been signed electronically.

## 2024-02-07 NOTE — Op Note (Signed)
 [facility] Patient Name: Kelly Haley Procedure Date: 02/07/2024 2:58 PM MRN: [id number] Endoscopist: Norleen SAILOR. Abran , MD, [id number] Age: 43 Referring MD:  Date of Birth: 1981-06-11 Gender: Female Account #: [id number] Procedure:                Colonoscopy with cold snare polypectomy x 4; biopsy                            polypectomy x 1 Indications:              Screening for colorectal malignant neoplasm. Father                            with colon cancer around age 49. Index exam Medicines:                Monitored Anesthesia Care Procedure:                Pre-Anesthesia Assessment:                           - Prior to the procedure, a History and Physical                            was performed, and patient medications and                            allergies were reviewed. The patient's tolerance of                            previous anesthesia was also reviewed. The risks                            and benefits of the procedure and the sedation                            options and risks were discussed with the patient.                            All questions were answered, and informed consent                            was obtained. Prior Anticoagulants: The patient has                            taken no anticoagulant or antiplatelet agents. ASA                            Grade Assessment: II - A patient with mild systemic                            disease. After reviewing the risks and benefits,                            the patient was deemed in satisfactory condition to  undergo the procedure.                           After obtaining informed consent, the colonoscope                            was passed under direct vision. Throughout the                            procedure, the patient's blood pressure, pulse, and                            oxygen saturations were monitored continuously. The                            CF  [id number] #[id number] was introduced through the anus                            and advanced to the the cecum, identified by                            appendiceal orifice and ileocecal valve. The                            ileocecal valve, appendiceal orifice, and rectum                            were photographed. The quality of the bowel                            preparation was excellent. The colonoscopy was                            performed without difficulty. The patient tolerated                            the procedure well. The bowel preparation used was                            SUPREP via split dose instruction. Scope In: 3:04:24 PM Scope Out: 3:21:52 PM Scope Withdrawal Time: 0 hours 13 minutes 10 seconds  Total Procedure Duration: 0 hours 17 minutes 28 seconds  Findings:                 Four polyps were found in the descending colon,                            hepatic flexure and ascending colon. The polyps                            were 2 to 5 mm in size. These polyps were removed                            with a cold snare. Resection and retrieval were  complete.                           A 1 mm polyp was found in the ascending colon. The                            polyp was removed with a jumbo cold forceps.                            Resection and retrieval were complete.                           The exam was otherwise without abnormality on                            direct and retroflexion views. Complications:            No immediate complications. Estimated blood loss:                            None. Estimated Blood Loss:     Estimated blood loss: none. Impression:               - Four 2 to 5 mm polyps in the descending colon, at                            the hepatic flexure and in the ascending colon,                            removed with a cold snare. Resected and retrieved.                           - One 1 mm polyp in the  ascending colon, removed                            with a jumbo cold forceps. Resected and retrieved.                           - The examination was otherwise normal on direct                            and retroflexion views. Recommendation:           - Repeat colonoscopy in 5 years for surveillance.                           - Patient has a contact number available for                            emergencies. The signs and symptoms of potential                            delayed complications were discussed with the  patient. Return to normal activities tomorrow.                            Written discharge instructions were provided to the                            patient.                           - Resume previous diet.                           - Continue present medications.                           - Await pathology results. Norleen SAILOR. Abran, MD 02/07/2024 3:28:45 PM This report has been signed electronically.

## 2024-02-07 NOTE — Progress Notes (Signed)
 Pt's states no medical or surgical changes since previsit or office visit.

## 2024-02-07 NOTE — Progress Notes (Deleted)
 Established Patient Office Visit  Subjective   Patient ID: Kelly Haley, female    DOB: [birth year]  Age: 43 y.o. MRN: [id number]  Chief Complaint  Patient presents with   Colonoscopy   Endoscopy    GERD   Colon Cancer Screening    HPI  {History (Optional):23778}  ROS    Objective:     BP 121/75   Pulse 96   Temp 97.7 F (36.5 C) (Skin)   Resp (!) 25   Ht 5' 4 (1.626 m)   Wt 274 lb (124.3 kg)   LMP 01/04/2024   SpO2 93%   BMI 47.03 kg/m  {Vitals History (Optional):23777}  Physical Exam   No results found for any visits on 02/07/24.  {Labs (Optional):23779}  The ASCVD Risk score (Arnett DK, et al., 2019) failed to calculate for the following reasons:   Cannot find a previous HDL lab   Cannot find a previous total cholesterol lab    Assessment & Plan:   Problem List Items Addressed This Visit   None Visit Diagnoses       Gastroesophageal reflux disease, unspecified whether esophagitis present    -  Primary   Relevant Medications   0.9 %  sodium chloride  infusion   Other Relevant Orders   Diet NPO   Hypoglycemia/Hyperglycemia protocol   Emergency Medications   ECG and pulse monitoring    Monitor NIBP   Notify physician   Monitor O2 SATs   NPO status   Vital signs   Monitor NIBP, ECG and PSA02   Notify physician   Discharge home with responsible adult.   Discharge instructions   Esophagogastroduodenoscopy: verify informed consent   Esophageal dilation: verify informed consent   Colonoscopy: verify informed consent     Constipation, unspecified constipation type       Relevant Medications   0.9 %  sodium chloride  infusion   Other Relevant Orders   Diet NPO   Hypoglycemia/Hyperglycemia protocol   Emergency Medications   ECG and pulse monitoring    Monitor NIBP   Notify physician   Monitor O2 SATs   NPO status   Vital signs   Monitor NIBP, ECG and PSA02   Notify physician   Discharge home with responsible adult.   Discharge  instructions   Esophagogastroduodenoscopy: verify informed consent   Esophageal dilation: verify informed consent   Colonoscopy: verify informed consent     Rectal bleeding       Relevant Medications   0.9 %  sodium chloride  infusion   Other Relevant Orders   Diet NPO   Hypoglycemia/Hyperglycemia protocol   Emergency Medications   ECG and pulse monitoring    Monitor NIBP   Notify physician   Monitor O2 SATs   NPO status   Vital signs   Monitor NIBP, ECG and PSA02   Notify physician   Discharge home with responsible adult.   Discharge instructions   Esophagogastroduodenoscopy: verify informed consent   Esophageal dilation: verify informed consent   Colonoscopy: verify informed consent     Family history of colon cancer       Relevant Medications   0.9 %  sodium chloride  infusion   Other Relevant Orders   Diet NPO   Hypoglycemia/Hyperglycemia protocol   Emergency Medications   ECG and pulse monitoring    Monitor NIBP   Notify physician   Monitor O2 SATs   NPO status   Vital signs   Monitor NIBP, ECG and PSA02   Notify  physician   Discharge home with responsible adult.   Discharge instructions   Esophagogastroduodenoscopy: verify informed consent   Esophageal dilation: verify informed consent   Colonoscopy: verify informed consent     Colon cancer screening         Benign neoplasm of ascending colon           No follow-ups on file.    Lucie LELON Macadam, RN

## 2024-02-08 ENCOUNTER — Telehealth: Payer: Self-pay

## 2024-02-08 NOTE — Telephone Encounter (Signed)
 Follow up call to pt, lm for pt to call if having any difficulty with normal activities or eating and drinking.  Also to call if any other questions or concerns.

## 2024-02-10 ENCOUNTER — Ambulatory Visit: Payer: Self-pay | Admitting: Internal Medicine

## 2024-02-10 LAB — SURGICAL PATHOLOGY

## 2024-02-22 ENCOUNTER — Ambulatory Visit: Payer: Self-pay | Admitting: Family Medicine

## 2024-03-22 ENCOUNTER — Other Ambulatory Visit (HOSPITAL_COMMUNITY): Payer: Self-pay

## 2024-03-22 ENCOUNTER — Telehealth: Payer: Self-pay

## 2024-03-22 NOTE — Telephone Encounter (Signed)
 Pharmacy Patient Advocate Encounter   Received notification from CoverMyMeds that prior authorization for Pantoprazole  Sodium 40MG  dr tablets is required/requested.   Insurance verification completed.   The patient is insured through [employer] [facility] .   Per test claim: PA required; PA submitted to above mentioned insurance via Latent Key/confirmation #/EOC [id number] Status is pending

## 2024-03-23 NOTE — Telephone Encounter (Signed)
 Pharmacy Patient Advocate Encounter  Received notification from [employer] [facility] that Prior Authorization for Pantoprazole  Sodium 40MG  dr tablets has been APPROVED from 03-22-2024 to 03-23-2027   PA #/Case ID/Reference #: [id number]

## 2024-03-28 ENCOUNTER — Telehealth: Payer: Self-pay | Admitting: Internal Medicine

## 2024-03-28 NOTE — Telephone Encounter (Signed)
 Called and left detailed message for pt that her hiatal hernia was listed as small on the report. No further treatment was recommended, just symptom management and pt is on protonix . Asked pt to call back if she had any further questions.

## 2024-03-28 NOTE — Telephone Encounter (Signed)
 PT had an ECL on 7/22 and was told that she had a hernia but there was no follow up instructions for treatment and would like to discuss that. Please advise.

## 2024-04-09 ENCOUNTER — Encounter (HOSPITAL_COMMUNITY): Payer: Self-pay

## 2024-04-09 ENCOUNTER — Ambulatory Visit (HOSPITAL_COMMUNITY)
Admission: EM | Admit: 2024-04-09 | Discharge: 2024-04-09 | Disposition: A | Discharge status: Home / Self Care | Attending: Emergency Medicine | Admitting: Emergency Medicine

## 2024-04-09 DIAGNOSIS — T148XXA Other injury of unspecified body region, initial encounter: Secondary | ICD-10-CM

## 2024-04-09 DIAGNOSIS — S91332A Puncture wound without foreign body, left foot, initial encounter: Secondary | ICD-10-CM

## 2024-04-09 DIAGNOSIS — L089 Local infection of the skin and subcutaneous tissue, unspecified: Secondary | ICD-10-CM | POA: Diagnosis not present

## 2024-04-09 MED ORDER — TETANUS-DIPHTH-ACELL PERTUSSIS 5-2.5-18.5 LF-MCG/0.5 IM SUSY
0.5000 mL | PREFILLED_SYRINGE | Freq: Once | INTRAMUSCULAR | Status: AC
  Administered 2024-04-09: 0.5 mL via INTRAMUSCULAR

## 2024-04-09 MED ORDER — MUPIROCIN 2 % EX OINT: 1.0000 | TOPICAL_OINTMENT | Freq: Two times a day (BID) | CUTANEOUS | 0 refills | Status: AC

## 2024-04-09 MED ORDER — TETANUS-DIPHTH-ACELL PERTUSSIS 5-2.5-18.5 LF-MCG/0.5 IM SUSY
PREFILLED_SYRINGE | INTRAMUSCULAR | Status: AC
  Filled 2024-04-09: qty 0.5

## 2024-04-09 MED ORDER — DOXYCYCLINE HYCLATE 100 MG PO CAPS: 100.0000 mg | ORAL_CAPSULE | Freq: Two times a day (BID) | ORAL | 0 refills | Status: AC

## 2024-04-09 NOTE — Discharge Instructions (Addendum)
 Start taking doxycycline  twice daily for 10 days for infection coverage. Apply mupirocin  ointment twice daily to the area for additional infection coverage. Keep the area clean dry and covered. Alternate between 650 mg of Tylenol  and 400 to 600 mg of ibuprofen  every 6-8 hours as needed for pain. Rest ice and elevate your foot throughout the day to help with swelling. Follow-up with [facility] if your symptoms continue for further evaluation. Follow-up with your primary care provider or return here as needed.

## 2024-04-09 NOTE — ED Provider Notes (Signed)
 [facility]    CSN: [id number] Arrival date & time: 04/09/24  1238      History   Chief Complaint Chief Complaint  Patient presents with   Foot Pain    HPI Kelly Haley is a 43 y.o. female.   Patient presents with concerns for right foot pain and swelling for the last 5 days.  Patient states that about 5 days ago she was at the beach and walking on sand when she accidentally stepped on a stick.  Patient states that she has had pain and swelling to her foot since then.  Patient reports that she has been taking Tylenol  without relief.  Patient denies any drainage from the area on her foot.  The history is provided by the patient and medical records.  Foot Pain    Past Medical History:  Diagnosis Date   Anxiety    Appendicitis    Chest pain    Dental infection    Flank pain    Headache    Hearing loss    Hypokalemia    Lumbar pain    Mass of right breast    Medical history non-contributory    Muscle strain    Near syncope    Pharyngitis    Pulsatile tinnitus of right ear    Sensorineural hearing loss    Urinary frequency    UTI (urinary tract infection)    Weakness     Patient Active Problem List   Diagnosis Date Noted   Near syncope 09/04/2021   Chest pain of uncertain etiology 09/04/2021   Palpitations 09/04/2021   Appendicitis 09/06/2018    Past Surgical History:  Procedure Laterality Date   APPENDECTOMY  09/06/2018   CARPAL TUNNEL RELEASE Left    CARPAL TUNNEL RELEASE Right    GALLBLADDER SURGERY     LAPAROSCOPIC APPENDECTOMY N/A 09/06/2018   Procedure: APPENDECTOMY LAPAROSCOPIC;  Surgeon: Rubin Calamity, MD;  Location: [facility] OR;  Service: General;  Laterality: N/A;   TUBAL LIGATION      OB History     Gravida  4   Para  3   Term  3   Preterm      AB  1   Living  3      SAB      IAB  1   Ectopic      Multiple      Live Births               Home Medications    Prior to Admission medications    Medication Sig Start Date End Date Taking? Authorizing Provider  doxycycline  (VIBRAMYCIN ) 100 MG capsule Take 1 capsule (100 mg total) by mouth 2 (two) times daily. 04/09/24  Yes Johnie, Amber Guthridge A, NP  mupirocin  ointment (BACTROBAN ) 2 % Apply 1 Application topically 2 (two) times daily. 04/09/24  Yes Johnie, Quana Chamberlain A, NP  baclofen  (LIORESAL ) 10 MG tablet Take 1 tablet (10 mg total) by mouth 3 (three) times daily. 02/03/24   Johnie Flaming A, NP  buPROPion (WELLBUTRIN SR) 150 MG 12 hr tablet Take 150 mg by mouth 2 (two) times daily. 11/01/23   [provider]  escitalopram (LEXAPRO) 10 MG tablet Take 10 mg by mouth at bedtime. Patient not taking: No sig reported    [provider]  famotidine (PEPCID) 40 MG tablet Take 40 mg by mouth daily as needed for heartburn or indigestion. Patient not taking: Reported on 02/07/2024    [provider]  fluticasone  (FLONASE ) 50 MCG/ACT nasal spray Place 2 sprays into both nostrils daily. Patient not taking: No sig reported 10/26/22   Jerral Meth, MD  HYDROcodone -acetaminophen  (NORCO/VICODIN) 5-325 MG tablet Take 1 tablet by mouth every 6 (six) hours as needed for moderate pain (pain score 4-6) or severe pain (pain score 7-10). Patient not taking: No sig reported 11/23/23   Rolinda Rogue, MD  ibuprofen  (ADVIL ) 200 MG tablet Take 400 mg by mouth every 6 (six) hours as needed for fever. Patient not taking: Reported on 02/07/2024 05/22/19   [provider]  lidocaine  (LIDODERM ) 5 % Place 1 patch onto the skin daily. Remove & Discard patch within 12 hours or as directed by MD Patient not taking: Reported on 02/07/2024 02/03/24   Johnie Flaming A, NP  meloxicam (MOBIC) 15 MG tablet Take 15 mg by mouth at bedtime. Patient not taking: No sig reported 11/21/23   [provider]  methocarbamol  1000 MG TABS Take 1,000 mg by mouth every 8 (eight) hours as needed for muscle spasms. Patient not taking: Reported on 02/07/2024  11/23/23   Rolinda Rogue, MD  ondansetron  (ZOFRAN -ODT) 8 MG disintegrating tablet Take 1 tablet (8 mg total) by mouth every 8 (eight) hours as needed for nausea or vomiting. Patient not taking: No sig reported 09/05/22   Dreama, [place]  N, FNP  pantoprazole  (PROTONIX ) 40 MG tablet Take 1 tablet (40 mg total) by mouth 2 (two) times daily. 02/07/24   Abran Norleen SAILOR, MD  rizatriptan  (MAXALT -MLT) 10 MG disintegrating tablet Take 1 tablet (10 mg total) by mouth as needed for migraine. May repeat in 2 hours if needed Patient not taking: No sig reported 02/20/23   Dean Clarity, MD    Family History Family History  Problem Relation Age of Onset   Colon cancer Father    Liver disease Neg Hx    Esophageal cancer Neg Hx     Social History Social History   Tobacco Use   Smoking status: Never    Passive exposure: Past   Smokeless tobacco: Never  Vaping Use   Vaping status: Never Used  Substance Use Topics   Alcohol use: Yes    Comment: occ.   Drug use: No     Allergies   Penicillins   Review of Systems Review of Systems  Per HPI  Physical Exam Triage Vital Signs ED Triage Vitals  Encounter Vitals Group     BP 04/09/24 1454 118/77     Girls Systolic BP Percentile --      Girls Diastolic BP Percentile --      Boys Systolic BP Percentile --      Boys Diastolic BP Percentile --      Pulse Rate 04/09/24 1453 91     Resp 04/09/24 1453 18     Temp 04/09/24 1453 98.2 F (36.8 C)     Temp Source 04/09/24 1453 Oral     SpO2 04/09/24 1453 100 %     Weight --      Height --      Head Circumference --      Peak Flow --      Pain Score 04/09/24 1452 5     Pain Loc --      Pain Education --      Exclude from Growth Chart --    No data found.  Updated Vital Signs BP 118/77   Pulse 91   Temp 98.2 F (36.8 C) (Oral)   Resp 18  LMP 03/14/2024 (Exact Date)   SpO2 100%   Visual Acuity Right Eye Distance:   Left Eye Distance:   Bilateral Distance:    Right Eye Near:    Left Eye Near:    Bilateral Near:     Physical Exam Vitals and nursing note reviewed.  Constitutional:      General: She is awake. She is not in acute distress.    Appearance: Normal appearance. She is well-developed and well-groomed. She is not ill-appearing.  Skin:    General: Skin is warm and dry.     Findings: Wound present.     Comments: Approximately half centimeter in diameter wound noted to the plantar aspect of the left foot with purulent drainage present.  Tenderness noted to this area.  Neurological:     Mental Status: She is alert.  Psychiatric:        Behavior: Behavior is cooperative.      UC Treatments / Results  Labs (all labs ordered are listed, but only abnormal results are displayed) Labs Reviewed - No data to display  EKG   Radiology No results found.  Procedures Procedures (including critical care time)  Medications Ordered in UC Medications  Tdap (BOOSTRIX ) injection 0.5 mL (0.5 mLs Intramuscular Given 04/09/24 1621)    Initial Impression / Assessment and Plan / UC Course  I have reviewed the triage vital signs and the nursing notes.  Pertinent labs & imaging results that were available during my care of the patient were reviewed by me and considered in my medical decision making (see chart for details).     Patient is overall well-appearing.  Vitals are stable.  Updated Tdap in clinic.  Prescribe doxycycline  and mupirocin  for infection prevention.  Discussed proper wound care.  Given podiatry follow-up.  Discussed follow-up and return precautions. Final Clinical Impressions(s) / UC Diagnoses   Final diagnoses:  Puncture wound of left foot, initial encounter  Wound infection     Discharge Instructions      Start taking doxycycline  twice daily for 10 days for infection coverage. Apply mupirocin  ointment twice daily to the area for additional infection coverage. Keep the area clean dry and covered. Alternate between 650 mg of Tylenol   and 400 to 600 mg of ibuprofen  every 6-8 hours as needed for pain. Rest ice and elevate your foot throughout the day to help with swelling. Follow-up with [facility] if your symptoms continue for further evaluation. Follow-up with your primary care provider or return here as needed.   ED Prescriptions     Medication Sig Dispense Auth. Provider   doxycycline  (VIBRAMYCIN ) 100 MG capsule Take 1 capsule (100 mg total) by mouth 2 (two) times daily. 20 capsule Johnie Flaming A, NP   mupirocin  ointment (BACTROBAN ) 2 % Apply 1 Application topically 2 (two) times daily. 22 g Johnie Flaming A, NP      PDMP not reviewed this encounter.   Johnie Flaming A, NP 04/09/24 [phone]

## 2024-04-09 NOTE — ED Triage Notes (Signed)
 Pt present with lt foot pain x five days.   States she was walking on the sand and stepped on a stick. Pt states she has taken tylenol  and has not had any relief. Reports swelling and pressure when walking.

## 2024-05-21 ENCOUNTER — Encounter: Payer: Self-pay | Admitting: Radiology

## 2024-05-21 ENCOUNTER — Ambulatory Visit: Admitting: Neurology

## 2024-05-21 ENCOUNTER — Encounter: Payer: Self-pay | Admitting: Neurology

## 2024-05-21 VITALS — BP 129/83 | HR 83 | Ht 64.0 in | Wt 271.0 lb

## 2024-05-21 DIAGNOSIS — M542 Cervicalgia: Secondary | ICD-10-CM | POA: Diagnosis not present

## 2024-05-21 DIAGNOSIS — M791 Myalgia, unspecified site: Secondary | ICD-10-CM

## 2024-05-21 MED ORDER — CELECOXIB 100 MG PO CAPS: 100.0000 mg | ORAL_CAPSULE | Freq: Two times a day (BID) | ORAL | 3 refills | Status: AC | PRN

## 2024-05-21 NOTE — Progress Notes (Signed)
 Chief Complaint  Patient presents with   RM15/NECK PAIN    Pt is here Alone. Pt here for persistent Neck Pain that has gotten worse within the past 6 months that causes her to have migraines.     ASSESSMENT AND PLAN  Kelly Haley is a 43 y.o. female   Chronic neck pain shoulder pain,  Significant tenderness at bilateral upper trapezius, suggestive of musculoskeletal etiology,  X-ray showed mild degenerative changes, no evidence of cervical radiculopathy based on the history and exam,  Suggested warm compression, neck stretching exercise  Refer her to physical therapy, dry needling,  Celebrex  as needed  Continue follow-up with primary care physician,  DIAGNOSTIC DATA (LABS, IMAGING, TESTING) - I reviewed patient records, labs, notes, testing and imaging myself where available.   MEDICAL HISTORY:  Kelly Haley is a 43 year old female, seen in request by [facility] nurse practitioner Desiree Quale, for evaluation of chronic neck shoulder pain, initial evaluation was on May 21, 2024  History is obtained from the patient and review of electronic medical records. I personally reviewed pertinent available imaging films in PACS.   PMHx of  Anxiety GERD Chronic migraine  Pre-DM Hx of Bilateral CTS release.  She works as a scientist, physiological, since 2024 noticed slow worsening neck pain, especially in recent 6 months, to the point of difficulty finding a comfortable position either lying down trying to sleep or at her work, she described constant midline neck pain, radiating to bilateral shoulder, deny radiating paresthesia to upper extremity, denies gait abnormality, she has chronic low back pain, do have obesity  She had x-ray of cervical spine in January 2025, we personally reviewed the film 2 view x-ray of the cervical spine shows disc height narrowing between  C6/C7.  There is some anterior and posterior spurring located at the same  location.  There are some noted facet arthropathy throughout the cervical  spine between C3 to C8   Over the past 1 year, she has tried over-the-counter Tylenol , ibuprofen  without helping her symptoms, diclofenac as needed was not sure about the benefit, even tried oxycodone , feel that afterwards, nauseous, a lot of GI side effect   PHYSICAL EXAM:   Vitals:   05/21/24 0834  BP: 129/83  Pulse: 83  SpO2: 99%  Weight: 271 lb (122.9 kg)  Height: 5' 4 (1.626 m)     Body mass index is 46.52 kg/m.  PHYSICAL EXAMNIATION:  Gen: NAD, conversant, well nourised, well groomed                     Cardiovascular: Regular rate rhythm, no peripheral edema, warm, nontender. Eyes: Conjunctivae clear without exudates or hemorrhage Neck: Supple, no carotid bruits. Pulmonary: Clear to auscultation bilaterally   NEUROLOGICAL EXAM:  MENTAL STATUS: Speech/cognition:  middle-age female, awake, alert, oriented to history taking and casual conversation CRANIAL NERVES: CN II: Visual fields are full to confrontation. Pupils are round equal and briskly reactive to light. CN III, IV, VI: extraocular movement are normal. No ptosis. CN V: Facial sensation is intact to light touch CN VII: Face is symmetric with normal eye closure  CN VIII: Hearing is normal to causal conversation. CN IX, X: Phonation is normal. CN XI: Head turning and shoulder shrug are intact  MOTOR: There is no pronator drift of out-stretched arms. Muscle bulk and tone are normal. Muscle strength is normal.  Significant tenderness at bilateral levator scapula, upper trapezius  REFLEXES: Reflexes are 1 and symmetric  at the biceps, triceps, knees, and ankles. Plantar responses are flexor.  SENSORY: Intact to light touch, pinprick and vibratory sensation are intact in fingers and toes.  COORDINATION: There is no trunk or limb dysmetria noted.  GAIT/STANCE: Posture is normal. Gait is steady    REVIEW OF SYSTEMS:  Full 14 system  review of systems performed and notable only for as above All other review of systems were negative.   ALLERGIES: Allergies  Allergen Reactions   Penicillins Hives and Nausea And Vomiting    TOLERATED CEFAZOLIN . Has patient had a PCN reaction causing immediate rash, facial/tongue/throat swelling, SOB or lightheadedness with hypotension: Yes Has patient had a PCN reaction causing severe rash involving mucus membranes or skin necrosis: No Has patient had a PCN reaction that required hospitalization Yes Has patient had a PCN reaction occurring within the last 10 years: No If all of the above answers are NO, then may proceed with [facility]    HOME MEDICATIONS: Current Outpatient Medications  Medication Sig Dispense Refill   buPROPion (WELLBUTRIN SR) 150 MG 12 hr tablet Take 150 mg by mouth 2 (two) times daily.     diclofenac (VOLTAREN) 75 MG EC tablet Take 75 mg by mouth 2 (two) times daily.     metFORMIN (GLUCOPHAGE-XR) 500 MG 24 hr tablet Take 500 mg by mouth.     pantoprazole  (PROTONIX ) 40 MG tablet Take 1 tablet (40 mg total) by mouth 2 (two) times daily. 60 tablet 11   baclofen  (LIORESAL ) 10 MG tablet Take 1 tablet (10 mg total) by mouth 3 (three) times daily. (Patient not taking: Reported on 05/21/2024) 30 each 0   doxycycline  (VIBRAMYCIN ) 100 MG capsule Take 1 capsule (100 mg total) by mouth 2 (two) times daily. (Patient not taking: Reported on 05/21/2024) 20 capsule 0   escitalopram (LEXAPRO) 10 MG tablet Take 10 mg by mouth at bedtime. (Patient not taking: Reported on 05/21/2024)     famotidine (PEPCID) 40 MG tablet Take 40 mg by mouth daily as needed for heartburn or indigestion. (Patient not taking: Reported on 05/21/2024)     fluticasone  (FLONASE ) 50 MCG/ACT nasal spray Place 2 sprays into both nostrils daily. (Patient not taking: Reported on 05/21/2024) 9.9 mL 0   HYDROcodone -acetaminophen  (NORCO/VICODIN) 5-325 MG tablet Take 1 tablet by mouth every 6 (six) hours as needed for  moderate pain (pain score 4-6) or severe pain (pain score 7-10). (Patient not taking: Reported on 05/21/2024) 8 tablet 0   ibuprofen  (ADVIL ) 200 MG tablet Take 400 mg by mouth every 6 (six) hours as needed for fever. (Patient not taking: Reported on 05/21/2024)     lidocaine  (LIDODERM ) 5 % Place 1 patch onto the skin daily. Remove & Discard patch within 12 hours or as directed by MD (Patient not taking: Reported on 05/21/2024) 30 patch 0   meloxicam (MOBIC) 15 MG tablet Take 15 mg by mouth at bedtime. (Patient not taking: Reported on 05/21/2024)     methocarbamol  1000 MG TABS Take 1,000 mg by mouth every 8 (eight) hours as needed for muscle spasms. (Patient not taking: Reported on 05/21/2024) 30 tablet 0   mupirocin  ointment (BACTROBAN ) 2 % Apply 1 Application topically 2 (two) times daily. (Patient not taking: Reported on 05/21/2024) 22 g 0   ondansetron  (ZOFRAN -ODT) 8 MG disintegrating tablet Take 1 tablet (8 mg total) by mouth every 8 (eight) hours as needed for nausea or vomiting. (Patient not taking: Reported on 05/21/2024) 20 tablet 0   rizatriptan  (MAXALT -MLT) 10 MG  disintegrating tablet Take 1 tablet (10 mg total) by mouth as needed for migraine. May repeat in 2 hours if needed (Patient not taking: Reported on 05/21/2024) 10 tablet 0   No current facility-administered medications for this visit.    PAST MEDICAL HISTORY: Past Medical History:  Diagnosis Date   Anxiety    Appendicitis    Chest pain    Dental infection    Flank pain    Headache    Hearing loss    Hypokalemia    Lumbar pain    Mass of right breast    Medical history non-contributory    Muscle strain    Near syncope    Pharyngitis    Pulsatile tinnitus of right ear    Sensorineural hearing loss    Urinary frequency    UTI (urinary tract infection)    Weakness     PAST SURGICAL HISTORY: Past Surgical History:  Procedure Laterality Date   APPENDECTOMY  09/06/2018   CARPAL TUNNEL RELEASE Left    CARPAL TUNNEL RELEASE  Right    GALLBLADDER SURGERY     LAPAROSCOPIC APPENDECTOMY N/A 09/06/2018   Procedure: APPENDECTOMY LAPAROSCOPIC;  Surgeon: Rubin Calamity, MD;  Location: [facility] OR;  Service: General;  Laterality: N/A;   TUBAL LIGATION      FAMILY HISTORY: Family History  Problem Relation Age of Onset   Colon cancer Father    Liver disease Neg Hx    Esophageal cancer Neg Hx     SOCIAL HISTORY: Social History   Socioeconomic History   Marital status: Legally Separated    Spouse name: Not on file   Number of children: 3   Years of education: Not on file   Highest education level: Not on file  Occupational History   Not on file  Tobacco Use   Smoking status: Never    Passive exposure: Past   Smokeless tobacco: Never  Vaping Use   Vaping status: Never Used  Substance and Sexual Activity   Alcohol use: Yes    Comment: occ.   Drug use: No   Sexual activity: Yes    Birth control/protection: Surgical  Other Topics Concern   Not on file  Social History Narrative   Not on file   Social Drivers of Health   Financial Resource Strain: Not on file  Food Insecurity: No Food Insecurity (06/04/2021)   Hunger Vital Sign    Worried About Running Out of Food in the Last Year: Never true    Ran Out of Food in the Last Year: Never true  Transportation Needs: No Transportation Needs (06/04/2021)   PRAPARE - Administrator, Civil Service (Medical): No    Lack of Transportation (Non-Medical): No  Physical Activity: Not on file  Stress: Not on file  Social Connections: Not on file  Intimate Partner Violence: Not on file      Modena Callander, M.D. Ph.D.  [facility] [address] [place], [place] [address] Ph: [phone] Fax: [phone]  CC:  Desiree Quale, NP [address] [place],  [place] [address]  [place], Jacqueline, NP

## 2024-05-22 ENCOUNTER — Telehealth: Payer: Self-pay | Admitting: Neurology

## 2024-05-22 NOTE — Telephone Encounter (Signed)
 Referral to Physical Therapy sent thru epic [facility]  [facility] Phone [phone]
